# Patient Record
Sex: Male | Born: 1935 | Race: Black or African American | Hispanic: No | Marital: Married | Smoking: Never smoker
Health system: Southern US, Community
[De-identification: ages and names within clinical notes are randomized; demographics above are authoritative.]

## PROBLEM LIST (undated history)

## (undated) DIAGNOSIS — M1990 Unspecified osteoarthritis, unspecified site: Secondary | ICD-10-CM

## (undated) DIAGNOSIS — I10 Essential (primary) hypertension: Secondary | ICD-10-CM

## (undated) DIAGNOSIS — H5440 Blindness, one eye, unspecified eye: Secondary | ICD-10-CM

---

## 2000-09-30 ENCOUNTER — Encounter: Payer: Self-pay | Admitting: Emergency Medicine

## 2000-09-30 ENCOUNTER — Emergency Department (HOSPITAL_COMMUNITY): Admission: EM | Admit: 2000-09-30 | Discharge: 2000-09-30 | Payer: Self-pay | Admitting: Emergency Medicine

## 2000-10-01 ENCOUNTER — Emergency Department (HOSPITAL_COMMUNITY): Admission: EM | Admit: 2000-10-01 | Discharge: 2000-10-01 | Payer: Self-pay | Admitting: Emergency Medicine

## 2000-10-02 ENCOUNTER — Emergency Department (HOSPITAL_COMMUNITY): Admission: EM | Admit: 2000-10-02 | Discharge: 2000-10-02 | Payer: Self-pay

## 2000-10-05 ENCOUNTER — Emergency Department (HOSPITAL_COMMUNITY): Admission: EM | Admit: 2000-10-05 | Discharge: 2000-10-05 | Payer: Self-pay | Admitting: Emergency Medicine

## 2000-10-05 ENCOUNTER — Encounter: Payer: Self-pay | Admitting: Emergency Medicine

## 2008-07-21 ENCOUNTER — Emergency Department (HOSPITAL_COMMUNITY): Admission: EM | Admit: 2008-07-21 | Discharge: 2008-07-21 | Payer: Self-pay | Admitting: Emergency Medicine

## 2008-07-23 ENCOUNTER — Emergency Department (HOSPITAL_COMMUNITY): Admission: EM | Admit: 2008-07-23 | Discharge: 2008-07-23 | Payer: Self-pay | Admitting: Emergency Medicine

## 2009-01-25 ENCOUNTER — Emergency Department (HOSPITAL_COMMUNITY): Admission: EM | Admit: 2009-01-25 | Discharge: 2009-01-25 | Payer: Self-pay | Admitting: Emergency Medicine

## 2011-06-13 LAB — URINALYSIS, ROUTINE W REFLEX MICROSCOPIC
Bilirubin Urine: NEGATIVE
Glucose, UA: NEGATIVE
Hgb urine dipstick: NEGATIVE
Ketones, ur: NEGATIVE
Nitrite: NEGATIVE
Specific Gravity, Urine: 1.002 — ABNORMAL LOW

## 2011-06-13 LAB — CBC
HCT: 36.1 — ABNORMAL LOW
RBC: 4.16 — ABNORMAL LOW
RDW: 15.1

## 2011-06-13 LAB — GLUCOSE, CAPILLARY

## 2011-06-13 LAB — DIFFERENTIAL
Eosinophils Absolute: 0.1
Eosinophils Relative: 2
Lymphocytes Relative: 24
Monocytes Relative: 9
Neutrophils Relative %: 65

## 2011-06-13 LAB — BASIC METABOLIC PANEL
BUN: 4 — ABNORMAL LOW
CO2: 28
Calcium: 9.3
Chloride: 106
Creatinine, Ser: 0.92
Potassium: 4.2

## 2011-06-25 ENCOUNTER — Inpatient Hospital Stay (INDEPENDENT_AMBULATORY_CARE_PROVIDER_SITE_OTHER)
Admission: RE | Admit: 2011-06-25 | Discharge: 2011-06-25 | Disposition: A | Discharge status: Home / Self Care | Payer: Medicare Other | Source: Ambulatory Visit | Attending: Family Medicine | Admitting: Family Medicine

## 2011-06-25 DIAGNOSIS — I10 Essential (primary) hypertension: Secondary | ICD-10-CM

## 2011-06-25 DIAGNOSIS — L03019 Cellulitis of unspecified finger: Secondary | ICD-10-CM

## 2011-06-25 LAB — POCT I-STAT, CHEM 8
BUN: 8 mg/dL (ref 6–23)
Creatinine, Ser: 0.8 mg/dL (ref 0.50–1.35)
Glucose, Bld: 102 mg/dL — ABNORMAL HIGH (ref 70–99)
Potassium: 3.6 mEq/L (ref 3.5–5.1)
Sodium: 141 mEq/L (ref 135–145)

## 2016-11-07 ENCOUNTER — Inpatient Hospital Stay (HOSPITAL_COMMUNITY)
Admission: EM | Admit: 2016-11-07 | Discharge: 2016-11-09 | DRG: 181 | Disposition: A | Discharge status: Hospice | Payer: Medicare Other | Attending: Internal Medicine | Admitting: Internal Medicine

## 2016-11-07 ENCOUNTER — Encounter (HOSPITAL_COMMUNITY): Payer: Self-pay | Admitting: Nurse Practitioner

## 2016-11-07 DIAGNOSIS — F17290 Nicotine dependence, other tobacco product, uncomplicated: Secondary | ICD-10-CM | POA: Diagnosis present

## 2016-11-07 DIAGNOSIS — Z66 Do not resuscitate: Secondary | ICD-10-CM | POA: Diagnosis present

## 2016-11-07 DIAGNOSIS — R627 Adult failure to thrive: Secondary | ICD-10-CM | POA: Diagnosis present

## 2016-11-07 DIAGNOSIS — M7989 Other specified soft tissue disorders: Secondary | ICD-10-CM | POA: Diagnosis not present

## 2016-11-07 DIAGNOSIS — C3490 Malignant neoplasm of unspecified part of unspecified bronchus or lung: Principal | ICD-10-CM | POA: Diagnosis present

## 2016-11-07 DIAGNOSIS — H5461 Unqualified visual loss, right eye, normal vision left eye: Secondary | ICD-10-CM | POA: Diagnosis present

## 2016-11-07 DIAGNOSIS — D649 Anemia, unspecified: Secondary | ICD-10-CM | POA: Diagnosis present

## 2016-11-07 DIAGNOSIS — L89151 Pressure ulcer of sacral region, stage 1: Secondary | ICD-10-CM | POA: Diagnosis present

## 2016-11-07 DIAGNOSIS — I82622 Acute embolism and thrombosis of deep veins of left upper extremity: Secondary | ICD-10-CM | POA: Diagnosis present

## 2016-11-07 DIAGNOSIS — I10 Essential (primary) hypertension: Secondary | ICD-10-CM | POA: Diagnosis present

## 2016-11-07 DIAGNOSIS — R64 Cachexia: Secondary | ICD-10-CM | POA: Diagnosis present

## 2016-11-07 DIAGNOSIS — I890 Lymphedema, not elsewhere classified: Secondary | ICD-10-CM | POA: Diagnosis present

## 2016-11-07 DIAGNOSIS — Z6822 Body mass index (BMI) 22.0-22.9, adult: Secondary | ICD-10-CM

## 2016-11-07 DIAGNOSIS — L8990 Pressure ulcer of unspecified site, unspecified stage: Secondary | ICD-10-CM | POA: Insufficient documentation

## 2016-11-07 DIAGNOSIS — R0902 Hypoxemia: Secondary | ICD-10-CM | POA: Diagnosis present

## 2016-11-07 DIAGNOSIS — E860 Dehydration: Secondary | ICD-10-CM | POA: Diagnosis present

## 2016-11-07 DIAGNOSIS — I871 Compression of vein: Secondary | ICD-10-CM | POA: Diagnosis present

## 2016-11-07 DIAGNOSIS — R918 Other nonspecific abnormal finding of lung field: Secondary | ICD-10-CM

## 2016-11-07 DIAGNOSIS — Z515 Encounter for palliative care: Secondary | ICD-10-CM | POA: Diagnosis present

## 2016-11-07 DIAGNOSIS — J90 Pleural effusion, not elsewhere classified: Secondary | ICD-10-CM | POA: Diagnosis present

## 2016-11-07 DIAGNOSIS — D72829 Elevated white blood cell count, unspecified: Secondary | ICD-10-CM | POA: Diagnosis present

## 2016-11-07 HISTORY — DX: Unspecified osteoarthritis, unspecified site: M19.90

## 2016-11-07 HISTORY — DX: Essential (primary) hypertension: I10

## 2016-11-07 HISTORY — DX: Blindness, one eye, unspecified eye: H54.40

## 2016-11-07 NOTE — ED Triage Notes (Signed)
EMS from home, family visited today and noticed entire left arm swelling.  Lives alone.  Pt reports that the swelling has been going on for weeks, placed a tourniquet on upper arm to stop swelling.  Now reports dec sensation in L hand, weak pulses L wrist.  Possible bed bugs d/t living situation.  Complains of chronic R flank pain that causes him not to have appetite. VS: 88/60, 72 bpm NSR, 14 RR, 106 CBG, 97% RA

## 2016-11-07 NOTE — ED Notes (Signed)
Bed: [facility] Expected date:  Expected time:  Means of arrival:  Comments: EMS arm pain

## 2016-11-08 ENCOUNTER — Emergency Department (HOSPITAL_COMMUNITY): Payer: Medicare Other

## 2016-11-08 ENCOUNTER — Observation Stay (HOSPITAL_BASED_OUTPATIENT_CLINIC_OR_DEPARTMENT_OTHER): Payer: Medicare Other

## 2016-11-08 ENCOUNTER — Encounter (HOSPITAL_COMMUNITY): Payer: Self-pay

## 2016-11-08 DIAGNOSIS — Z515 Encounter for palliative care: Secondary | ICD-10-CM

## 2016-11-08 DIAGNOSIS — J90 Pleural effusion, not elsewhere classified: Secondary | ICD-10-CM | POA: Diagnosis present

## 2016-11-08 DIAGNOSIS — H5461 Unqualified visual loss, right eye, normal vision left eye: Secondary | ICD-10-CM | POA: Diagnosis present

## 2016-11-08 DIAGNOSIS — R918 Other nonspecific abnormal finding of lung field: Secondary | ICD-10-CM | POA: Diagnosis not present

## 2016-11-08 DIAGNOSIS — I871 Compression of vein: Secondary | ICD-10-CM | POA: Diagnosis present

## 2016-11-08 DIAGNOSIS — M7989 Other specified soft tissue disorders: Secondary | ICD-10-CM

## 2016-11-08 DIAGNOSIS — R627 Adult failure to thrive: Secondary | ICD-10-CM | POA: Diagnosis not present

## 2016-11-08 DIAGNOSIS — R0902 Hypoxemia: Secondary | ICD-10-CM | POA: Diagnosis present

## 2016-11-08 DIAGNOSIS — F17290 Nicotine dependence, other tobacco product, uncomplicated: Secondary | ICD-10-CM | POA: Diagnosis present

## 2016-11-08 DIAGNOSIS — D649 Anemia, unspecified: Secondary | ICD-10-CM | POA: Diagnosis not present

## 2016-11-08 DIAGNOSIS — I82622 Acute embolism and thrombosis of deep veins of left upper extremity: Secondary | ICD-10-CM | POA: Diagnosis present

## 2016-11-08 DIAGNOSIS — I10 Essential (primary) hypertension: Secondary | ICD-10-CM | POA: Diagnosis present

## 2016-11-08 DIAGNOSIS — I890 Lymphedema, not elsewhere classified: Secondary | ICD-10-CM | POA: Diagnosis present

## 2016-11-08 DIAGNOSIS — Z66 Do not resuscitate: Secondary | ICD-10-CM | POA: Diagnosis present

## 2016-11-08 DIAGNOSIS — D72829 Elevated white blood cell count, unspecified: Secondary | ICD-10-CM | POA: Diagnosis present

## 2016-11-08 DIAGNOSIS — E860 Dehydration: Secondary | ICD-10-CM | POA: Diagnosis present

## 2016-11-08 DIAGNOSIS — C3490 Malignant neoplasm of unspecified part of unspecified bronchus or lung: Secondary | ICD-10-CM | POA: Diagnosis present

## 2016-11-08 DIAGNOSIS — Z6822 Body mass index (BMI) 22.0-22.9, adult: Secondary | ICD-10-CM | POA: Diagnosis not present

## 2016-11-08 DIAGNOSIS — R64 Cachexia: Secondary | ICD-10-CM | POA: Diagnosis present

## 2016-11-08 DIAGNOSIS — L89151 Pressure ulcer of sacral region, stage 1: Secondary | ICD-10-CM | POA: Diagnosis present

## 2016-11-08 LAB — URINALYSIS, ROUTINE W REFLEX MICROSCOPIC
BILIRUBIN URINE: NEGATIVE
Glucose, UA: NEGATIVE mg/dL
Hgb urine dipstick: NEGATIVE
KETONES UR: 20 mg/dL — AB
Leukocytes, UA: NEGATIVE
NITRITE: NEGATIVE
PH: 5 (ref 5.0–8.0)
Protein, ur: NEGATIVE mg/dL
Specific Gravity, Urine: >1.046 — ABNORMAL HIGH (ref 1.005–1.030)

## 2016-11-08 LAB — PROTIME-INR
INR: 1.37
PROTHROMBIN TIME: 17 s — AB (ref 11.4–15.2)

## 2016-11-08 LAB — CBC WITH DIFFERENTIAL/PLATELET
Basophils Absolute: 0 10*3/uL (ref 0.0–0.1)
Basophils Relative: 0 %
EOS PCT: 0 %
Eosinophils Absolute: 0 10*3/uL (ref 0.0–0.7)
HCT: 33.7 % — ABNORMAL LOW (ref 39.0–52.0)
Hemoglobin: 11.2 g/dL — ABNORMAL LOW (ref 13.0–17.0)
Lymphocytes Relative: 6 %
Lymphs Abs: 0.8 10*3/uL (ref 0.7–4.0)
MCH: 26.5 pg (ref 26.0–34.0)
MCHC: 33.2 g/dL (ref 30.0–36.0)
MCV: 79.9 fL (ref 78.0–100.0)
MONOS PCT: 4 %
Monocytes Absolute: 0.5 10*3/uL (ref 0.1–1.0)
NEUTROS ABS: 12.5 10*3/uL — AB (ref 1.7–7.7)
NEUTROS PCT: 91 %
PLATELETS: 135 10*3/uL — AB (ref 150–400)
RBC: 4.22 MIL/uL (ref 4.22–5.81)
RDW: 16.2 % — AB (ref 11.5–15.5)
WBC: 13.7 10*3/uL — AB (ref 4.0–10.5)

## 2016-11-08 LAB — I-STAT TROPONIN, ED: Troponin i, poc: 0.01 ng/mL (ref 0.00–0.08)

## 2016-11-08 LAB — LIPASE, BLOOD: LIPASE: 16 U/L (ref 11–51)

## 2016-11-08 LAB — I-STAT CHEM 8, ED
BUN: 32 mg/dL — AB (ref 6–20)
Calcium, Ion: 1.44 mmol/L — ABNORMAL HIGH (ref 1.15–1.40)
Chloride: 96 mmol/L — ABNORMAL LOW (ref 101–111)
Creatinine, Ser: 1 mg/dL (ref 0.61–1.24)
Glucose, Bld: 93 mg/dL (ref 65–99)
HEMATOCRIT: 40 % (ref 39.0–52.0)
Hemoglobin: 13.6 g/dL (ref 13.0–17.0)
Potassium: 4 mmol/L (ref 3.5–5.1)
SODIUM: 135 mmol/L (ref 135–145)
TCO2: 28 mmol/L (ref 0–100)

## 2016-11-08 LAB — COMPREHENSIVE METABOLIC PANEL
ALT: 21 U/L (ref 17–63)
ANION GAP: 11 (ref 5–15)
AST: 45 U/L — ABNORMAL HIGH (ref 15–41)
Albumin: 3.1 g/dL — ABNORMAL LOW (ref 3.5–5.0)
Alkaline Phosphatase: 123 U/L (ref 38–126)
BUN: 33 mg/dL — ABNORMAL HIGH (ref 6–20)
CHLORIDE: 99 mmol/L — AB (ref 101–111)
CO2: 26 mmol/L (ref 22–32)
CREATININE: 0.81 mg/dL (ref 0.61–1.24)
Calcium: 11.9 mg/dL — ABNORMAL HIGH (ref 8.9–10.3)
Glucose, Bld: 95 mg/dL (ref 65–99)
POTASSIUM: 4.2 mmol/L (ref 3.5–5.1)
Sodium: 136 mmol/L (ref 135–145)
Total Bilirubin: 1.4 mg/dL — ABNORMAL HIGH (ref 0.3–1.2)
Total Protein: 7.2 g/dL (ref 6.5–8.1)

## 2016-11-08 MED ORDER — ACETAMINOPHEN 650 MG RE SUPP: 650.0000 mg | Freq: Four times a day (QID) | RECTAL | Status: DC | PRN

## 2016-11-08 MED ORDER — ENOXAPARIN SODIUM 100 MG/ML ~~LOC~~ SOLN
1.5000 mg/kg | SUBCUTANEOUS | Status: DC
  Administered 2016-11-09: 100 mg via SUBCUTANEOUS
  Filled 2016-11-08: qty 1

## 2016-11-08 MED ORDER — IPRATROPIUM-ALBUTEROL 0.5-2.5 (3) MG/3ML IN SOLN: 3.0000 mL | Freq: Four times a day (QID) | RESPIRATORY_TRACT | Status: DC

## 2016-11-08 MED ORDER — ACETAMINOPHEN 325 MG PO TABS: 650.0000 mg | ORAL_TABLET | Freq: Four times a day (QID) | ORAL | Status: DC | PRN

## 2016-11-08 MED ORDER — IOPAMIDOL (ISOVUE-370) INJECTION 76%
100.0000 mL | Freq: Once | INTRAVENOUS | Status: AC | PRN
  Administered 2016-11-08: 100 mL via INTRAVENOUS

## 2016-11-08 MED ORDER — SODIUM CHLORIDE 0.9 % IV BOLUS (SEPSIS)
1000.0000 mL | Freq: Once | INTRAVENOUS | Status: AC
  Administered 2016-11-08: 1000 mL via INTRAVENOUS

## 2016-11-08 MED ORDER — IOPAMIDOL (ISOVUE-370) INJECTION 76%
INTRAVENOUS | Status: AC
  Administered 2016-11-08: 100 mL via INTRAVENOUS
  Filled 2016-11-08: qty 100

## 2016-11-08 MED ORDER — ENOXAPARIN SODIUM 80 MG/0.8ML ~~LOC~~ SOLN
1.0000 mg/kg | Freq: Once | SUBCUTANEOUS | Status: AC
  Administered 2016-11-08: 65 mg via SUBCUTANEOUS
  Filled 2016-11-08: qty 0.8

## 2016-11-08 MED ORDER — SODIUM CHLORIDE 0.9 % IV SOLN
INTRAVENOUS | Status: DC
  Administered 2016-11-08: 05:00:00 via INTRAVENOUS
  Administered 2016-11-09: 1000 mL via INTRAVENOUS

## 2016-11-08 MED ORDER — HYDROCODONE-ACETAMINOPHEN 5-325 MG PO TABS: 1.0000 | ORAL_TABLET | ORAL | Status: DC | PRN

## 2016-11-08 MED ORDER — IPRATROPIUM-ALBUTEROL 0.5-2.5 (3) MG/3ML IN SOLN
3.0000 mL | Freq: Three times a day (TID) | RESPIRATORY_TRACT | Status: DC
  Administered 2016-11-08 – 2016-11-09 (×3): 3 mL via RESPIRATORY_TRACT
  Filled 2016-11-08 (×3): qty 3

## 2016-11-08 MED ORDER — ACETAMINOPHEN 325 MG PO TABS
650.0000 mg | ORAL_TABLET | Freq: Three times a day (TID) | ORAL | Status: DC
  Administered 2016-11-08 – 2016-11-09 (×3): 650 mg via ORAL
  Filled 2016-11-08 (×3): qty 2

## 2016-11-08 MED ORDER — IPRATROPIUM-ALBUTEROL 0.5-2.5 (3) MG/3ML IN SOLN
3.0000 mL | Freq: Four times a day (QID) | RESPIRATORY_TRACT | Status: DC
  Administered 2016-11-08: 3 mL via RESPIRATORY_TRACT
  Filled 2016-11-08: qty 3

## 2016-11-08 MED ORDER — ONDANSETRON HCL 4 MG PO TABS: 4.0000 mg | ORAL_TABLET | Freq: Four times a day (QID) | ORAL | Status: DC | PRN

## 2016-11-08 MED ORDER — PANTOPRAZOLE SODIUM 40 MG IV SOLR
40.0000 mg | Freq: Two times a day (BID) | INTRAVENOUS | Status: DC
  Administered 2016-11-08 – 2016-11-09 (×3): 40 mg via INTRAVENOUS
  Filled 2016-11-08 (×3): qty 40

## 2016-11-08 MED ORDER — IPRATROPIUM-ALBUTEROL 0.5-2.5 (3) MG/3ML IN SOLN: 3.0000 mL | RESPIRATORY_TRACT | Status: DC | PRN

## 2016-11-08 MED ORDER — SIMETHICONE 80 MG PO CHEW: 80.0000 mg | CHEWABLE_TABLET | Freq: Four times a day (QID) | ORAL | Status: DC | PRN

## 2016-11-08 MED ORDER — IPRATROPIUM-ALBUTEROL 0.5-2.5 (3) MG/3ML IN SOLN: 3.0000 mL | Freq: Once | RESPIRATORY_TRACT | Status: DC

## 2016-11-08 MED ORDER — ENSURE ENLIVE PO LIQD
237.0000 mL | Freq: Three times a day (TID) | ORAL | Status: DC
  Administered 2016-11-08 – 2016-11-09 (×2): 237 mL via ORAL

## 2016-11-08 MED ORDER — BISACODYL 5 MG PO TBEC
5.0000 mg | DELAYED_RELEASE_TABLET | Freq: Every day | ORAL | Status: DC
  Administered 2016-11-08: 5 mg via ORAL
  Filled 2016-11-08: qty 1

## 2016-11-08 MED ORDER — ENOXAPARIN SODIUM 40 MG/0.4ML ~~LOC~~ SOLN
40.0000 mg | SUBCUTANEOUS | Status: DC
Start: 2016-11-08 — End: 2016-11-08
  Administered 2016-11-08: 40 mg via SUBCUTANEOUS
  Filled 2016-11-08: qty 0.4

## 2016-11-08 MED ORDER — GUAIFENESIN 100 MG/5ML PO SOLN: 200.0000 mg | Freq: Three times a day (TID) | ORAL | Status: DC | PRN

## 2016-11-08 MED ORDER — MORPHINE SULFATE (PF) 4 MG/ML IV SOLN: 1.0000 mg | INTRAVENOUS | Status: DC | PRN

## 2016-11-08 MED ORDER — ONDANSETRON HCL 4 MG/2ML IJ SOLN: 4.0000 mg | Freq: Four times a day (QID) | INTRAMUSCULAR | Status: DC | PRN

## 2016-11-08 NOTE — Consult Note (Signed)
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 Found to have a very large lung mass compressing the pulmonary veins and vasculature. 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  Clinical Assessment and Goals of Care: I met with Mr. Redner today who tells me he is ready to go and that he feels like God is cradling him like a mother who holds a newborn close to her chest- he tells me he feels complete peace, that the pain and swelling are leaving his body. He speaks to me in Rhymes and poetry, he makes jokes and tells big stories. He told me that he built [employer] and half of [employer]. He is proud of the 4 generations of family in his room. They are aware of how sick he is and that he is dying.  NEXT OF KIN- daughters present in the room.    SUMMARY OF RECOMMENDATIONS    1. Patient does not have any desire to see doctors or choose medically aggressive treatment or intervention -he has lived this long and really never seen a doctor or used medical services. He has self treated and when he tied a tournequet around his arm to stop the swelling things got much worse so he agreed after family forced him to come to the hospital.  No oncology consultation needed  Focus on comfort and dignity  2. Patient cannot live by himself. Family are discussing options.  3. I recommended a HOSPICE  facility for his care- he is going to have a sudden and rapid death-<2 weeks-I reviewed his scans and this tumor is sitting right next to major vessels. He also has pain and swelling in his left arm that need to be managed- he has dyspnea with minimal exertion.    Code Status/Advance Care Planning:  DNR    Symptom Management:   Scheduled Tylenol  PRN morphine or hydrocodone  Palliative Prophylaxis:   Bowel Regimen  Additional Recommendations (Limitations, Scope, Preferences):  Minimize Medications  Psycho-social/Spiritual:   Desire for further Chaplaincy support:yes  Additional Recommendations: Caregiving  Support/Resources  Prognosis:   < 2 weeks  Discharge Planning: Hospice facility      Primary Diagnoses: Present on Admission: . Failure to thrive in adult . Mass of right lung . Leukocytosis . Left arm swelling . Anemia . Hypercalcemia   I have reviewed the medical record, interviewed the patient and family, and examined the patient. The following aspects are pertinent.  Past Medical History:  Diagnosis Date  . Arthritis   .  Blind right eye   . Hypertension    Social History   Social History  . Marital status: Married    Spouse name: N/A  . Number of children: N/A  . Years of education: N/A   Social History Main Topics  . Smoking status: Never Smoker  . Smokeless tobacco: Current User    Types: Snuff  . Alcohol use No  . Drug use: No  . Sexual activity: No   Other Topics Concern  . None   Social History Narrative  . None   No family history on file. Scheduled Meds: . enoxaparin (LOVENOX) injection  40 mg Subcutaneous Q24H  . feeding supplement (ENSURE ENLIVE)  237 mL Oral TID BM  . ipratropium-albuterol  3 mL Nebulization Once  . pantoprazole (PROTONIX) IV  40 mg Intravenous Q12H   Continuous Infusions: . sodium chloride 75 mL/hr at 11/08/16 0433   PRN Meds:.acetaminophen **OR** acetaminophen, guaiFENesin,  HYDROcodone-acetaminophen, ipratropium-albuterol, morphine injection, ondansetron **OR** ondansetron (ZOFRAN) IV, simethicone Medications Prior to Admission:  Prior to Admission medications   Medication Sig Start Date End Date Taking? Authorizing Provider  guaiFENesin (ROBITUSSIN) 100 MG/5ML liquid Take 200 mg by mouth 3 (three) times daily as needed for cough.   Yes Historical Provider, MD  simethicone (MYLICON) 80 MG chewable tablet Chew 80 mg by mouth every 6 (six) hours as needed for flatulence.   Yes Historical Provider, MD   No Known Allergies Review of Systems  Physical Exam  Vital Signs: BP 97/70 (BP Location: Right Arm)   Pulse 67   Temp 97.9 F (36.6 C) (Oral)   Resp 16   Ht 5' 8" (1.727 m)   Wt 65.8 kg (145 lb)   SpO2 90%   BMI 22.05 kg/m  Pain Assessment: No/denies pain   Pain Score: 0-No pain   SpO2: SpO2: 90 % O2 Device:SpO2: 90 % O2 Flow Rate: .   IO: Intake/output summary:  Intake/Output Summary (Last 24 hours) at 11/08/16 1138 Last data filed at 11/08/16 1003  Gross per 24 hour  Intake              480 ml  Output                0 ml  Net              480 ml    LBM:   Baseline Weight: Weight: 65.8 kg (145 lb) Most recent weight: Weight: 65.8 kg (145 lb)     Palliative Assessment/Data:   Flowsheet Rows   Flowsheet Row Most Recent Value  Intake Tab  Referral Department  Hospitalist  Unit at Time of Referral  Oncology Unit  Palliative Care Primary Diagnosis  Cancer  Date Notified  11/08/16  Palliative Care Type  New Palliative care  Reason for referral  Clarify Goals of Care, Counsel Regarding Hospice  Date of Admission  11/08/16  Date first seen by Palliative Care  11/08/16  # of days Palliative referral response time  0 Day(s)  # of days IP prior to Palliative referral  0  Clinical Assessment  Palliative Performance Scale Score  30%  Pain Max last 24 hours  0  Pain Min Last 24 hours  0  Dyspnea Max Last 24 Hours  8  Dyspnea Min Last 24  hours  0  Nausea Max Last 24 Hours  0  Nausea Min Last 24 Hours  0  Anxiety Max Last 24 Hours  0  Anxiety Min Last 24   Hours  0  Other Max Last 24 Hours  0  Psychosocial & Spiritual Assessment  Palliative Care Outcomes  Palliative Care Outcomes  Clarified goals of care, Counseled regarding hospice       Time Total: 70 minutes Greater than 50%  of this time was spent counseling and coordinating care related to the above assessment and plan.  Signed by: Lane Hacker, DO   Please contact Palliative Medicine Team phone at [phone] for questions and concerns.  For individual provider: See Shea Evans

## 2016-11-08 NOTE — Progress Notes (Signed)
PROGRESS NOTE    Jeffren Dombek  BWL:[id number] DOB: 02/25/1921 DOA: 11/07/2016 PCP: No PCP Per Patient    Brief Narrative: Thomas Christensen is a 83 y.o. male with medical history significant of HTN, blind in right eye, and arthritis; who presents complaints of progressively worsening left arm swelling for the last 3 weeks. He reports associated symptoms of tightness in his chest, right flank pain, nausea, generalized weakness, decreased oral intake, and weight loss of unknown amount. Patient reports trying to take prune juice and Gas-x without relief of symptoms. He reports intermittently forcing herself to vomit with some mild relief of symptoms. At baseline patient lives alone and was previously able to complete all of his ADLs. However, over the last week or more he's gotten to the point which he was unable to walk or care for himself like normal at home. Family members normally come to check on him once every 1-2 weeks. Family members had come to visit him today and seen the patient was not able to care for himself along with the left arm swelling and had called EMS.   Assessment & Plan:   Principal Problem:   Failure to thrive in adult Active Problems:   Mass of right lung   Leukocytosis   Left arm swelling   Anemia   Hypercalcemia   1-Lung Mass, pleural effusion; probably lung cancer.  Palliative care consulted. Option for thoracentesis was provide to patient and family. They had discussion with palliative care team, goal is for comfort care.  Hoping for residential hospice placement.     DVT prophylaxis: lovenox.  Code Status: DNR Family Communication: care discussed with multiples family member  Disposition Plan: remain in the hospital.    Consultants:   Palliative care,    Procedures: doppler   Antimicrobials: none  Subjective: Patient denies pain. He has left arm swelling for a while.  Multiples family members at bedside,   Objective: Vitals:   11/08/16 0529  11/08/16 0602 11/08/16 0641 11/08/16 0800  BP: 99/62 111/69 97/70   Pulse: 61 84 67   Resp: '20 10 16   '$ Temp:      TempSrc:   Oral   SpO2: 90% (!) 89% 90%   Weight:      Height:    '5\' 8"'$  (1.727 m)    Intake/Output Summary (Last 24 hours) at 11/08/16 1110 Last data filed at 11/08/16 1003  Gross per 24 hour  Intake              480 ml  Output                0 ml  Net              480 ml   Filed Weights   11/07/16 2353  Weight: 65.8 kg (145 lb)    Examination:  General exam: Appears calm and comfortable  Respiratory system: Clear to auscultation. Respiratory effort normal. Cardiovascular system: S1 & S2 heard, RRR. No JVD, murmurs, rubs, gallops or clicks. No pedal edema. Gastrointestinal system: Abdomen is nondistended, soft and nontender. No organomegaly or masses felt. Normal bowel sounds heard. Central nervous system: Alert and oriented. No focal neurological deficits. Extremities: Symmetric 5 x 5 power. Swelling left arm.  Skin: multiples nodule skin, extremities.      Data Reviewed: I have personally reviewed following labs and imaging studies  CBC:  Recent Labs Lab 11/08/16 0040 11/08/16 0052  WBC 13.7*  --   NEUTROABS 12.5*  --  HGB 11.2* 13.6  HCT 33.7* 40.0  MCV 79.9  --   PLT 135*  --    Basic Metabolic Panel:  Recent Labs Lab 11/08/16 0040 11/08/16 0052  NA 136 135  K 4.2 4.0  CL 99* 96*  CO2 26  --   GLUCOSE 95 93  BUN 33* 32*  CREATININE 0.81 1.00  CALCIUM 11.9*  --    GFR: Estimated Creatinine Clearance: 43 mL/min (by C-G formula based on SCr of 1 mg/dL). Liver Function Tests:  Recent Labs Lab 11/08/16 0040  AST 45*  ALT 21  ALKPHOS 123  BILITOT 1.4*  PROT 7.2  ALBUMIN 3.1*    Recent Labs Lab 11/08/16 0040  LIPASE 16   No results for input(s): AMMONIA in the last 168 hours. Coagulation Profile:  Recent Labs Lab 11/08/16 0040  INR 1.37   Cardiac Enzymes: No results for input(s): CKTOTAL, CKMB, CKMBINDEX,  TROPONINI in the last 168 hours. BNP (last 3 results) No results for input(s): PROBNP in the last 8760 hours. HbA1C: No results for input(s): HGBA1C in the last 72 hours. CBG: No results for input(s): GLUCAP in the last 168 hours. Lipid Profile: No results for input(s): CHOL, HDL, LDLCALC, TRIG, CHOLHDL, LDLDIRECT in the last 72 hours. Thyroid Function Tests: No results for input(s): TSH, T4TOTAL, FREET4, T3FREE, THYROIDAB in the last 72 hours. Anemia Panel: No results for input(s): VITAMINB12, FOLATE, FERRITIN, TIBC, IRON, RETICCTPCT in the last 72 hours. Sepsis Labs: No results for input(s): PROCALCITON, LATICACIDVEN in the last 168 hours.  No results found for this or any previous visit (from the past 240 hour(s)).       Radiology Studies: Ct Angio Chest/abd/pel For Dissection W And/or Wo Contrast  Result Date: 11/08/2016 CLINICAL DATA:  Chest and abdominal pain radiating to the back. EXAM: CT ANGIOGRAPHY CHEST, ABDOMEN AND PELVIS TECHNIQUE: Multidetector CT imaging through the chest, abdomen and pelvis was performed using the standard protocol during bolus administration of intravenous contrast. Multiplanar reconstructed images and MIPs were obtained and reviewed to evaluate the vascular anatomy. CONTRAST:  100 cc Isovue 370 IV COMPARISON:  Chest radiograph 07/21/2008.  No recent comparison. FINDINGS: CTA CHEST FINDINGS Cardiovascular: Diffuse thoracic aortic tortuosity with mild atherosclerosis. Mild aneurysmal dilatation of the ascending segment measuring 4.1 cm maximal dimension. There is no dissection. No central pulmonary embolus. Large right lung mass causes compression and likely invasion of the right pulmonary veins. Decreased density of the blood pool consistent with anemia. Multiple mediastinal collaterals. Mediastinum/Nodes: Large heterogeneous right hilar mass near completely filling the right middle lobe. Discretely separate hilar adenopathy is not visualized. No definite  left hilar adenopathy. No enlarged mediastinal lymph nodes. Patulous upper esophagus. Lungs/Pleura: Advanced emphysema. Large right middle lobe mass measures 10 x 9.3 x 10 cm, heterogeneous and enhancing. This mass causes compression of the right upper lobe bronchus. Advanced emphysema. There is a moderate-sized right pleural effusion with internal loculations/enhancement concerning for malignant effusion. Adjacent atelectasis in the right lower lobe. No additional nodule or pulmonary mass. Scattered mucus in the trachea and right lower lobe bronchus. Musculoskeletal: No blastic or definite destructive lytic lesions. Paucity of body fat suggesting cachexia. Enhancing subcutaneous nodule posteriorly measures 2.3 cm superficial to T9. There is enhancing left axillary nodule, may be lymph node or subcutaneous metastasis. Review of the MIP images confirms the above findings. CTA ABDOMEN AND PELVIS FINDINGS VASCULAR Aorta: Diffuse tortuosity and mild atherosclerosis. No aneurysm or dissection. Celiac: Patent without evidence of aneurysm, dissection, vasculitis or  significant stenosis. SMA: Patent without evidence of aneurysm, dissection, vasculitis or significant stenosis. Renals: Both renal arteries are patent without evidence of aneurysm, dissection, vasculitis, fibromuscular dysplasia or significant stenosis. IMA: Patent without evidence of aneurysm, dissection, vasculitis or significant stenosis. Inflow: Tortuous with atherosclerosis. Both common iliac arteries measures 16 mm greatest dimension. Veins: Not well assessed on this arterial phase exam. Review of the MIP images confirms the above findings. NON-VASCULAR Hepatobiliary: Limited assessment for focal lesion given arterial phase imaging. Enhancing lesion in the inferior right lobe measures 2 cm. Gallbladder physiologically distended, no calcified stone. No biliary dilatation. Pancreas: Not well assessed due to paucity of intra-abdominal fat, atrophic  parenchyma. No ductal dilatation or evidence of inflammation. Spleen: Small in size, not well evaluated. Adrenals/Urinary Tract: Multiple bilateral renal cysts. Question of enhancing cortical lesion in the right kidney measures 14 x 10 mm, image 103 series 6, versus pararenal lesion. No hydronephrosis. Adrenal glands are not well-defined. Bladder is physiologically distended. Stomach/Bowel: Not well evaluated given paucity of intra-abdominal fat and lack of enteric contrast. Soft tissue density in the pelvis is likely decompressed bowel, but not well-defined. Lymphatic: Enhancing subcutaneous nodules superficial to both gluteal muscles, largest on the right measures 2.4 cm. Limited assessment for intraabdominal adenopathy given technique and paucity of body fat. Probable enhancing left retroperitoneal nodes. Small enhancing nodule in the left pericolic gutter. Reproductive: Heterogeneous prostate gland. Other: Generalized paucity of body fat consistent with cachexia. Musculoskeletal: Enhancing lesion within or adjacent to the left iliopsoas muscle, suboptimally defined. Smaller enhancing lesion adjacent to the right iliopsoas muscle. Scoliosis and multilevel degenerative change throughout spine. Diffusely decreased bone mineral density. No blastic or definite destructive lytic lesions. Review of the MIP images confirms the above findings. IMPRESSION: 1. Large 10 cm right middle lobe mass compressing or invading the right inferior pulmonary veins. Moderate right pleural effusion, with internal all loculations concerning for malignant effusion. Favor primary lung malignancy given presence of advanced emphysema. Metastatic disease is considered less likely. 2. Enhancing lesion abuts the medial right kidney may be a primary renal lesion, which would be concerning for primary malignancy, versus pararenal metastasis. 3. Multifocal enhancing nodules in the subcutaneous tissues, adjacent to the iliopsoas musculature, left  retroperitoneum, left pericolic gutter, concerning for metastatic disease. Enhancing lesion in the inferior right lobe of the liver. 4. No aortic dissection. Diffuse thoracoabdominal aortic tortuosity. Aneurysmal dilatation of the ascending aorta, maximal dimension 4.1 cm. Recommend annual imaging followup by CTA or MRA pending patient condition. 5. Advanced emphysema. 6. Cachexia. Electronically Signed   By: Jeb Levering M.D.   On: 11/08/2016 02:38        Scheduled Meds: . enoxaparin (LOVENOX) injection  40 mg Subcutaneous Q24H  . feeding supplement (ENSURE ENLIVE)  237 mL Oral TID BM  . ipratropium-albuterol  3 mL Nebulization Once  . pantoprazole (PROTONIX) IV  40 mg Intravenous Q12H   Continuous Infusions: . sodium chloride 75 mL/hr at 11/08/16 0433     LOS: 0 days    Time spent: 35 minutes.     Elmarie Shiley, MD [facility] Pager [phone]  If 7PM-7AM, please contact night-coverage [email or web address] Password [email or web address] 11/08/2016, 11:10 AM

## 2016-11-08 NOTE — H&P (Signed)
History and Physical    Thomas Christensen IFO:[id number] DOB: April 13, 1921 DOA: 11/07/2016  Referring MD/NP/PA: Dr. Claudine Mouton PCP: No PCP Per Patient  Patient coming from: Home via EMS  Chief Complaint: Left arm swelling  HPI: Thomas Christensen is a 83 y.o. male with medical history significant of HTN, blind in right eye, and arthritis; who presents complaints of progressively worsening left arm swelling for the last 3 weeks. He reports associated symptoms of tightness in his chest, right flank pain, nausea, generalized weakness, decreased oral intake, and weight loss of unknown amount. Patient reports trying to take prune juice and Gas-x without relief of symptoms. He reports intermittently forcing herself to vomit with some mild relief of symptoms. At baseline patient lives alone and was previously able to complete all of his ADLs. However, over the last week or more he's gotten to the point which he was unable to walk or care for himself like normal at home. Family members normally come to check on him once every 1-2 weeks. Family members had come to visit him today and seen the patient was not able to care for himself along with the left arm swelling and had called EMS.  ED Course: Upon admission into the emergency and patient was  noted to be afebrile, pulse 77 and 97, respirations up to 22, blood pressure 95/66 to the 122/71, and O2 saturations 90%. Lab work revealed WBC 13.7, hemoglobin 11.2, platelets 135, BUN 33, creatinine 0.81. Urinalysis was negative for any signs of infection. CT angiogram of the chest revealed a large 10 cm mass of the right middle lobe of the lung, moderate right sided pleural effusion that appears loculated, lymphadenopathy, multiple subcutaneous enhancing nodules, and AAA up to 4.1 cm. Findings were discussed with the patient who requested no life support and states when the good [facility] is ready to take him it is his time.  Review of Systems: As per HPI otherwise 10 point review of  systems negative.   Past Medical History:  Diagnosis Date  . Arthritis   . Blind right eye   . Hypertension     History reviewed. No pertinent surgical history.   reports that he has never smoked. His smokeless tobacco use includes Snuff. He reports that he does not drink alcohol or use drugs.  No Known Allergies  No family history on file.  Prior to Admission medications   Medication Sig Start Date End Date Taking? Authorizing Provider  guaiFENesin (ROBITUSSIN) 100 MG/5ML liquid Take 200 mg by mouth 3 (three) times daily as needed for cough.   Yes Historical Provider, MD  simethicone (MYLICON) 80 MG chewable tablet Chew 80 mg by mouth every 6 (six) hours as needed for flatulence.   Yes Historical Provider, MD    Physical Exam:  Constitutional: Cachectic elderly male who appears acutely ill. Vitals:   11/07/16 2353 11/07/16 2355 11/08/16 0201 11/08/16 0247  BP:  101/66 122/71 96/73  Pulse:  79 81 87  Resp:  '18 18 12  '$ Temp:  97.9 F (36.6 C)    TempSrc:  Oral    SpO2:  100% 99% 100%  Weight: 65.8 kg (145 lb)     Height: '5\' 8"'$  (1.727 m)      Eyes: Blind in right eye ENMT: Mucous membranes are moist. Posterior pharynx clear of any exudate or lesions.Normal dentition.  Neck: normal, supple, no masses, no thyromegaly Respiratory: Decreased overall aeration noted on the right middle and lower lung fields. Positive rales noted.  Cardiovascular: Distant  heart sounds with Regular rate and rhythm, no murmurs / rubs / gallops. Left upper extremity edema twice size of the right upper extremity. 2+ pedal pulses. No carotid bruits.  Abdomen: no tenderness, no masses palpated. No hepatosplenomegaly. Bowel sounds positive.  Musculoskeletal: Clubbing noted of the bilateral hands. Significant Skin: Multiple subcutaneous nodules noted on the skin measuring 1-2 cm in diameter. rologic: CN 2-12 grossly intact. Sensation intact, DTR normal. Strength 5/5 in all 4.  Psychiatric: Normal  judgment and insight. Alert and oriented x 3. Normal mood.     Labs on Admission: I have personally reviewed following labs and imaging studies  CBC:  Recent Labs Lab 11/08/16 0040 11/08/16 0052  WBC 13.7*  --   NEUTROABS 12.5*  --   HGB 11.2* 13.6  HCT 33.7* 40.0  MCV 79.9  --   PLT 135*  --    Basic Metabolic Panel:  Recent Labs Lab 11/08/16 0040 11/08/16 0052  NA 136 135  K 4.2 4.0  CL 99* 96*  CO2 26  --   GLUCOSE 95 93  BUN 33* 32*  CREATININE 0.81 1.00  CALCIUM 11.9*  --    GFR: Estimated Creatinine Clearance: 43 mL/min (by C-G formula based on SCr of 1 mg/dL). Liver Function Tests:  Recent Labs Lab 11/08/16 0040  AST 45*  ALT 21  ALKPHOS 123  BILITOT 1.4*  PROT 7.2  ALBUMIN 3.1*    Recent Labs Lab 11/08/16 0040  LIPASE 16   No results for input(s): AMMONIA in the last 168 hours. Coagulation Profile:  Recent Labs Lab 11/08/16 0040  INR 1.37   Cardiac Enzymes: No results for input(s): CKTOTAL, CKMB, CKMBINDEX, TROPONINI in the last 168 hours. BNP (last 3 results) No results for input(s): PROBNP in the last 8760 hours. HbA1C: No results for input(s): HGBA1C in the last 72 hours. CBG: No results for input(s): GLUCAP in the last 168 hours. Lipid Profile: No results for input(s): CHOL, HDL, LDLCALC, TRIG, CHOLHDL, LDLDIRECT in the last 72 hours. Thyroid Function Tests: No results for input(s): TSH, T4TOTAL, FREET4, T3FREE, THYROIDAB in the last 72 hours. Anemia Panel: No results for input(s): VITAMINB12, FOLATE, FERRITIN, TIBC, IRON, RETICCTPCT in the last 72 hours. Urine analysis:    Component Value Date/Time   COLORURINE YELLOW 07/21/2008 1344   APPEARANCEUR CLOUDY (A) 07/21/2008 1344   LABSPEC 1.002 (L) 07/21/2008 1344   PHURINE 7.5 07/21/2008 1344   GLUCOSEU NEGATIVE 07/21/2008 1344   HGBUR NEGATIVE 07/21/2008 1344   BILIRUBINUR NEGATIVE 07/21/2008 1344   KETONESUR NEGATIVE 07/21/2008 1344   PROTEINUR NEGATIVE 07/21/2008 1344    UROBILINOGEN 0.2 07/21/2008 1344   NITRITE NEGATIVE 07/21/2008 1344   LEUKOCYTESUR  07/21/2008 1344    NEGATIVE MICROSCOPIC NOT DONE ON URINES WITH NEGATIVE PROTEIN, BLOOD, LEUKOCYTES, NITRITE, OR GLUCOSE <1000 mg/dL.   Sepsis Labs: No results found for this or any previous visit (from the past 240 hour(s)).   Radiological Exams on Admission: Ct Angio Chest/abd/pel For Dissection W And/or Wo Contrast  Result Date: 11/08/2016 CLINICAL DATA:  Chest and abdominal pain radiating to the back. EXAM: CT ANGIOGRAPHY CHEST, ABDOMEN AND PELVIS TECHNIQUE: Multidetector CT imaging through the chest, abdomen and pelvis was performed using the standard protocol during bolus administration of intravenous contrast. Multiplanar reconstructed images and MIPs were obtained and reviewed to evaluate the vascular anatomy. CONTRAST:  100 cc Isovue 370 IV COMPARISON:  Chest radiograph 07/21/2008.  No recent comparison. FINDINGS: CTA CHEST FINDINGS Cardiovascular: Diffuse thoracic aortic tortuosity with  mild atherosclerosis. Mild aneurysmal dilatation of the ascending segment measuring 4.1 cm maximal dimension. There is no dissection. No central pulmonary embolus. Large right lung mass causes compression and likely invasion of the right pulmonary veins. Decreased density of the blood pool consistent with anemia. Multiple mediastinal collaterals. Mediastinum/Nodes: Large heterogeneous right hilar mass near completely filling the right middle lobe. Discretely separate hilar adenopathy is not visualized. No definite left hilar adenopathy. No enlarged mediastinal lymph nodes. Patulous upper esophagus. Lungs/Pleura: Advanced emphysema. Large right middle lobe mass measures 10 x 9.3 x 10 cm, heterogeneous and enhancing. This mass causes compression of the right upper lobe bronchus. Advanced emphysema. There is a moderate-sized right pleural effusion with internal loculations/enhancement concerning for malignant effusion. Adjacent  atelectasis in the right lower lobe. No additional nodule or pulmonary mass. Scattered mucus in the trachea and right lower lobe bronchus. Musculoskeletal: No blastic or definite destructive lytic lesions. Paucity of body fat suggesting cachexia. Enhancing subcutaneous nodule posteriorly measures 2.3 cm superficial to T9. There is enhancing left axillary nodule, may be lymph node or subcutaneous metastasis. Review of the MIP images confirms the above findings. CTA ABDOMEN AND PELVIS FINDINGS VASCULAR Aorta: Diffuse tortuosity and mild atherosclerosis. No aneurysm or dissection. Celiac: Patent without evidence of aneurysm, dissection, vasculitis or significant stenosis. SMA: Patent without evidence of aneurysm, dissection, vasculitis or significant stenosis. Renals: Both renal arteries are patent without evidence of aneurysm, dissection, vasculitis, fibromuscular dysplasia or significant stenosis. IMA: Patent without evidence of aneurysm, dissection, vasculitis or significant stenosis. Inflow: Tortuous with atherosclerosis. Both common iliac arteries measures 16 mm greatest dimension. Veins: Not well assessed on this arterial phase exam. Review of the MIP images confirms the above findings. NON-VASCULAR Hepatobiliary: Limited assessment for focal lesion given arterial phase imaging. Enhancing lesion in the inferior right lobe measures 2 cm. Gallbladder physiologically distended, no calcified stone. No biliary dilatation. Pancreas: Not well assessed due to paucity of intra-abdominal fat, atrophic parenchyma. No ductal dilatation or evidence of inflammation. Spleen: Small in size, not well evaluated. Adrenals/Urinary Tract: Multiple bilateral renal cysts. Question of enhancing cortical lesion in the right kidney measures 14 x 10 mm, image 103 series 6, versus pararenal lesion. No hydronephrosis. Adrenal glands are not well-defined. Bladder is physiologically distended. Stomach/Bowel: Not well evaluated given paucity of  intra-abdominal fat and lack of enteric contrast. Soft tissue density in the pelvis is likely decompressed bowel, but not well-defined. Lymphatic: Enhancing subcutaneous nodules superficial to both gluteal muscles, largest on the right measures 2.4 cm. Limited assessment for intraabdominal adenopathy given technique and paucity of body fat. Probable enhancing left retroperitoneal nodes. Small enhancing nodule in the left pericolic gutter. Reproductive: Heterogeneous prostate gland. Other: Generalized paucity of body fat consistent with cachexia. Musculoskeletal: Enhancing lesion within or adjacent to the left iliopsoas muscle, suboptimally defined. Smaller enhancing lesion adjacent to the right iliopsoas muscle. Scoliosis and multilevel degenerative change throughout spine. Diffusely decreased bone mineral density. No blastic or definite destructive lytic lesions. Review of the MIP images confirms the above findings. IMPRESSION: 1. Large 10 cm right middle lobe mass compressing or invading the right inferior pulmonary veins. Moderate right pleural effusion, with internal all loculations concerning for malignant effusion. Favor primary lung malignancy given presence of advanced emphysema. Metastatic disease is considered less likely. 2. Enhancing lesion abuts the medial right kidney may be a primary renal lesion, which would be concerning for primary malignancy, versus pararenal metastasis. 3. Multifocal enhancing nodules in the subcutaneous tissues, adjacent to the iliopsoas musculature, left  retroperitoneum, left pericolic gutter, concerning for metastatic disease. Enhancing lesion in the inferior right lobe of the liver. 4. No aortic dissection. Diffuse thoracoabdominal aortic tortuosity. Aneurysmal dilatation of the ascending aorta, maximal dimension 4.1 cm. Recommend annual imaging followup by CTA or MRA pending patient condition. 5. Advanced emphysema. 6. Cachexia. Electronically Signed   By: Jeb Levering  M.D.   On: 11/08/2016 02:38    EKG: Independently reviewed. Sinus rhythm with premature atrial complexes  Assessment/Plan Failure to thrive: Acute. Patient found to be significantly cachectic and in poor -  Admit to MedSurg bed  -  Ensure feeding supplement  Dehydration:  patient admits to decreased oral intake and also found to have elevated BUN to creatinine ratio.  - NS IVF at 75 ml/hr as tolerated  Right lung mass with lymphadenopathy/right pleural effusion: Acute. Seen on CT scan. Suggestive of stage IV disease. Patient currently noted to be of poor functional status. - Consult to palliative care - Consider thoracentesis for definitive diagnosis - May need to consult oncology as well for other palliative treatment option  Hypoxia: O2 saturations intermittently seen to be in the upper 80s on admission. - Nasal cannula oxygen 2 L  Left arm swelling: Acute. over the last 3 weeks question if related to tumor. - Vascular Doppler ultrasound of the left upper extremity  Leukocytosis: Acute. WBC elevated at 13.7 on admission. No clear source of infection noted on urinalysis. Question secondary to acute process. - Continue to monitor  Hypercalcemia: Calcium noted to be 11.9 on admission. Likely secondary to malignancy - IVF  - Follow-up repeat BMP  Anemia: Initial hemoglobin noted to be 11.2. No  previous baseline hemoglobin available. - Continue to monitor  DVT prophylaxis: lovenox Code Status: DO NOT RESUSCITATE Family Communication: Discussed plan of care with the patient and his great-great-grandfather Disposition Plan:  Likely needs placement Consults called: none Admission status: Observation  Norval Morton MD [facility] Pager [phone]  If 7PM-7AM, please contact night-coverage [email or web address] Password [email or web address]  11/08/2016, 3:02 AM

## 2016-11-08 NOTE — ED Notes (Signed)
Patient transported to CT 

## 2016-11-08 NOTE — Clinical Social Work Note (Signed)
Clinical Social Work Assessment  Patient Details  Name: Thomas Christensen MRN: [id number] Date of Birth: 1921-09-02  Date of referral:  11/08/16               Reason for consult:  Discharge Planning                Permission sought to share information with:  Facility Art therapist granted to share information::  Yes, Verbal Permission Granted  Name::        Agency::     Relationship::     Contact Information:     Housing/Transportation Living arrangements for the past 2 months:  Apartment Source of Information:  Patient, Other (Comment Required) (granddaughters) Patient Interpreter Needed:  None Criminal Activity/Legal Involvement Pertinent to Current Situation/Hospitalization:  No - Comment as needed Significant Relationships:  Adult Children, Other Family Members Lives with:  Self Do you feel safe going back to the place where you live?  Yes Need for family participation in patient care:  Yes (Comment)  Care giving concerns:  Family isn't sure if pt's care can be managed at home following hospital d/c.   Social Worker assessment / plan:  Pt hospitalized from home on 11/07/16 with Failure to thrive. CSW consulted to assist with residential hospice home placement. CSW spoke with pt and multiple family members at bedside. Pt indicated that he wanted family to stay with him at home. Granddaughter Thomas Christensen [facility] cell # [phone] ) feels pt may do better at a residential hospice home. CSW has contacted [facility], at granddaughter request, to see if a family member can stay overnight with pt. Granddaughter informed that [facility] does allow a family member to stay over night. [place] has no openings today. CSW left VM for granddaughter to contact CSW to assist with other residential hospice home options. Awaiting return call. CSW will continue to follow to assist with d/c planning needs.  Employment status:  Retired Forensic scientist:  Medicare PT  Recommendations:  Not assessed at this time [place] / Referral to community resources:  Other (Comment Required) (residential hospice home)  Patient/Family's Response to care:  Granddaughter feels residential hospice home is needed.   Patient/Family's Understanding of and Emotional Response to Diagnosis, Current Treatment, and Prognosis:  Pt / Granddaughter are aware of pt's medical status. Palliative Care Team met with them today. Pt wants family to stay with him at home but granddaughter feels pt will do better at residential hospice home. Granddaughter feels pt will agree to this plan if a family member can be with him at facility. Family members will continue to talk about residential hospice placement. Pt has a very large family and it isn't clear if all members are on the same page with dc planning. CSW will contact [facility] in the am to check availability and update granddaughter. Family has a list of all residential hospice homes and will review other options.    Emotional Assessment Appearance:  Appears stated age Attitude/Demeanor/Rapport:  Other (cooperative) Affect (typically observed):  Accepting, Appropriate, Pleasant Orientation:  Oriented to Self, Oriented to Place, Oriented to  Time, Oriented to Situation Alcohol / Substance use:  Not Applicable Psych involvement (Current and /or in the community):  No (Comment)  Discharge Needs  Concerns to be addressed:  Discharge Planning Concerns Readmission within the last 30 days:  No Current discharge risk:  None Barriers to Discharge:  No Barriers Identified   Loraine Maple  [phone] 11/08/2016, 2:44 PM

## 2016-11-08 NOTE — Progress Notes (Addendum)
ANTICOAGULATION CONSULT NOTE - Initial Consult  Pharmacy Consult for Enoxaparin Indication: VTE treatment  No Known Allergies  Patient Measurements: Height: '5\' 8"'$  (172.7 cm) Weight: 145 lb (65.8 kg) IBW/kg (Calculated) : 68.4   Vital Signs: Temp: 97.6 F (36.4 C) (02/28 1338) Temp Source: Oral (02/28 1338) BP: 116/79 (02/28 1338) Pulse Rate: 87 (02/28 1338)  Labs:  Recent Labs  11/08/16 0040 11/08/16 0052  HGB 11.2* 13.6  HCT 33.7* 40.0  PLT 135*  --   LABPROT 17.0*  --   INR 1.37  --   CREATININE 0.81 1.00    Estimated Creatinine Clearance: 43 mL/min (by C-G formula based on SCr of 1 mg/dL).   Medical History: Past Medical History:  Diagnosis Date  . Arthritis   . Blind right eye   . Hypertension      Assessment: 36 y/oM with PMH of arthritis, blindness in right eye, HTN who presented [facility] ED on 11/08/2016 with worsening L arm swelling, tightness in chest, R flank pain, nausea, weakness, decreased PO intake, and weight loss. CT of chest revealed lung mass. LUE venous duplex positive for DVT. Pharmacy consulted to dose Lovenox for VTE treatment.   Today, 11/08/16:  Last dose of Lovenox '40mg'$  SQ at 1003  SCr 1 with CrCl ~ 43 ml/min  CBC: Hgb 13.6, Pltc slightly low at 135K  No bleeding reported  Goal of Therapy:  Anti-Xa level 0.6-1 units/ml 4hrs after LMWH dose given Monitor platelets by anticoagulation protocol: Yes   Plan:  Lovenox '1mg'$ /kg ('65mg'$ ) SQ this PM at 1800. Then start Lovenox 1.5 mg/kg ('100mg'$ ) SQ q24h tomorrow AM.  Given focus of comfort measures at this time, have not ordered f/u CBC, SCr at this time.  Monitor for s/sx of bleeding.  Luiz Ochoa 11/08/2016,3:33 PM

## 2016-11-08 NOTE — ED Notes (Signed)
Attempted to call floor.  Will call back in a few minutes.

## 2016-11-08 NOTE — ED Notes (Signed)
Asked pt if he could give a urine sample, but said he's not able to.

## 2016-11-08 NOTE — ED Notes (Signed)
Attempted to get a good consistent pulse ox on pt.  Values varied, most consistently ranged between 90-95% before cutting out.  MD aware of this.

## 2016-11-08 NOTE — ED Notes (Addendum)
Pt is here alone. States he has family that comes in to check on him at times. He is very cachetic appearing. States he is able to ambulate but very weak. States "I have not been to the doctor in over 10 years because I can take care of myself and I know what to take when I am sick." Denies any medical problems other than htn and arthritis. He states his left arm began swelling over 2-3 weeks ago but the swelling got worst over the past week and has moved to his hand. His left arm and hand is grossly edematous and cool to touch. Radial pulse weak along with weak grip. He states "oh it's just arthritis". Denies any shortness of breath, recent falls or injuries to left arm or hand, chest pain, dizziness, or light headedness. His clothing is very dirty and fingernails and personal hygiene is unclean. When asked who helps take care of him he stated "I take care of myself and sometimes my neighbors will come over and see me and bring me something to eat."

## 2016-11-08 NOTE — ED Provider Notes (Signed)
[facility] DEPT Provider Note   CSN: [id number] Arrival date & time: 11/07/16  2305   By signing my name below, I, Thomas Christensen, attest that this documentation has been prepared under the direction and in the presence of Thomas Balls, MD. Electronically Signed: Soijett Christensen, ED Scribe. 11/08/16. 12:27 AM.  History   Chief Complaint No chief complaint on file.   HPI Thomas Christensen is a 83 y.o. male with a PMHx of HTN, arthritis, who presents to the Emergency Department complaining of left arm swelling onset 2-3 weeks ago. Pt reports associated chest tightness x 3-4 weeks and "knot" to right inner thigh. He notes that his chest tightness is the same throughout the day. Pt has not tried any medications for the relief of his symptoms. Pt daughter notes that she last saw the pt several months ago and she didn't notice the swelling to the pt left arm. He denies fever, chills, and any other symptoms.     The history is provided by the patient and a relative. No language interpreter was used.    Past Medical History:  Diagnosis Date  . Arthritis   . Blind right eye   . Hypertension     There are no active problems to display for this patient.   History reviewed. No pertinent surgical history.     Home Medications    Prior to Admission medications   Medication Sig Start Date End Date Taking? Authorizing Provider  guaiFENesin (ROBITUSSIN) 100 MG/5ML liquid Take 200 mg by mouth 3 (three) times daily as needed for cough.   Yes Historical Provider, MD  simethicone (MYLICON) 80 MG chewable tablet Chew 80 mg by mouth every 6 (six) hours as needed for flatulence.   Yes Historical Provider, MD    Family History No family history on file.  Social History Social History  Substance Use Topics  . Smoking status: Never Smoker  . Smokeless tobacco: Current User    Types: Snuff  . Alcohol use No     Allergies   Patient has no known allergies.   Review of Systems Review of  Systems A complete 10 system review of systems was obtained and all systems are negative except as noted in the HPI and PMH.   Physical Exam Updated Vital Signs BP 96/73 (BP Location: Right Arm)   Pulse 87   Temp 97.9 F (36.6 C) (Oral)   Resp 12   Ht '5\' 8"'$  (1.727 m)   Wt 145 lb (65.8 kg)   SpO2 (!) 88%   BMI 22.05 kg/m   Physical Exam  Constitutional: He is oriented to person, place, and time. Vital signs are normal. He appears well-developed. He appears cachectic.  Non-toxic appearance. He does not appear ill. No distress.  Cachetic appearing  HENT:  Head: Normocephalic and atraumatic.  Nose: Nose normal.  Mouth/Throat: No oropharyngeal exudate.  Dry oropharynx  Neck: Normal range of motion. Neck supple. No tracheal deviation, no edema, no erythema and normal range of motion present. No thyroid mass and no thyromegaly present.  Cardiovascular: Normal rate, regular rhythm, S1 normal, S2 normal, normal heart sounds, intact distal pulses and normal pulses.  Exam reveals no gallop and no friction rub.   No murmur heard. Pulmonary/Chest: Effort normal and breath sounds normal. No respiratory distress. He has no wheezes. He has no rhonchi. He has no rales.  Abdominal: Soft. Normal appearance and bowel sounds are normal. He exhibits no distension, no ascites and no mass. There is no hepatosplenomegaly.  There is no tenderness. There is no rebound, no guarding and no CVA tenderness.  Musculoskeletal: Normal range of motion. He exhibits no tenderness.       Left upper arm: He exhibits edema.  LUE lymphedema. Nl pulses and sensation to LUE. Right thigh lipoma.   Lymphadenopathy:    He has no cervical adenopathy.  Neurological: He is alert and oriented to person, place, and time. He has normal strength. No cranial nerve deficit or sensory deficit.  Skin: Skin is warm, dry and intact. No petechiae and no rash noted. He is not diaphoretic. No erythema. No pallor.  Nursing note and vitals  reviewed.    ED Treatments / Results  DIAGNOSTIC STUDIES: Oxygen Saturation is 100% on RA, nl by my interpretation.    COORDINATION OF CARE: 12:24 AM Discussed treatment plan with pt at bedside which includes CT chest/abd/pel for dissections, labs, UA, EKG, and pt agreed to plan.   Labs (all labs ordered are listed, but only abnormal results are displayed) Labs Reviewed  CBC WITH DIFFERENTIAL/PLATELET - Abnormal; Notable for the following:       Result Value   WBC 13.7 (*)    Hemoglobin 11.2 (*)    HCT 33.7 (*)    RDW 16.2 (*)    Platelets 135 (*)    Neutro Abs 12.5 (*)    All other components within normal limits  COMPREHENSIVE METABOLIC PANEL - Abnormal; Notable for the following:    Chloride 99 (*)    BUN 33 (*)    Calcium 11.9 (*)    Albumin 3.1 (*)    AST 45 (*)    Total Bilirubin 1.4 (*)    All other components within normal limits  PROTIME-INR - Abnormal; Notable for the following:    Prothrombin Time 17.0 (*)    All other components within normal limits  I-STAT CHEM 8, ED - Abnormal; Notable for the following:    Chloride 96 (*)    BUN 32 (*)    Calcium, Ion 1.44 (*)    All other components within normal limits  URINE CULTURE  LIPASE, BLOOD  URINALYSIS, ROUTINE W REFLEX MICROSCOPIC  I-STAT TROPOININ, ED    EKG  EKG Interpretation  Date/Time:  Wednesday November 08 2016 01:09:09 EST Ventricular Rate:  96 PR Interval:    QRS Duration: 62 QT Interval:  331 QTC Calculation: 419 R Axis:   74 Text Interpretation:  Normal sinus rhythm Premature atrial complexes PACs are new Confirmed by Glynn Octave [id number]) on 11/08/2016 1:58:15 AM       Radiology Ct Angio Chest/abd/pel For Dissection W And/or Wo Contrast  Result Date: 11/08/2016 CLINICAL DATA:  Chest and abdominal pain radiating to the back. EXAM: CT ANGIOGRAPHY CHEST, ABDOMEN AND PELVIS TECHNIQUE: Multidetector CT imaging through the chest, abdomen and pelvis was performed using the standard  protocol during bolus administration of intravenous contrast. Multiplanar reconstructed images and MIPs were obtained and reviewed to evaluate the vascular anatomy. CONTRAST:  100 cc Isovue 370 IV COMPARISON:  Chest radiograph 07/21/2008.  No recent comparison. FINDINGS: CTA CHEST FINDINGS Cardiovascular: Diffuse thoracic aortic tortuosity with mild atherosclerosis. Mild aneurysmal dilatation of the ascending segment measuring 4.1 cm maximal dimension. There is no dissection. No central pulmonary embolus. Large right lung mass causes compression and likely invasion of the right pulmonary veins. Decreased density of the blood pool consistent with anemia. Multiple mediastinal collaterals. Mediastinum/Nodes: Large heterogeneous right hilar mass near completely filling the right middle lobe. Discretely separate  hilar adenopathy is not visualized. No definite left hilar adenopathy. No enlarged mediastinal lymph nodes. Patulous upper esophagus. Lungs/Pleura: Advanced emphysema. Large right middle lobe mass measures 10 x 9.3 x 10 cm, heterogeneous and enhancing. This mass causes compression of the right upper lobe bronchus. Advanced emphysema. There is a moderate-sized right pleural effusion with internal loculations/enhancement concerning for malignant effusion. Adjacent atelectasis in the right lower lobe. No additional nodule or pulmonary mass. Scattered mucus in the trachea and right lower lobe bronchus. Musculoskeletal: No blastic or definite destructive lytic lesions. Paucity of body fat suggesting cachexia. Enhancing subcutaneous nodule posteriorly measures 2.3 cm superficial to T9. There is enhancing left axillary nodule, may be lymph node or subcutaneous metastasis. Review of the MIP images confirms the above findings. CTA ABDOMEN AND PELVIS FINDINGS VASCULAR Aorta: Diffuse tortuosity and mild atherosclerosis. No aneurysm or dissection. Celiac: Patent without evidence of aneurysm, dissection, vasculitis or  significant stenosis. SMA: Patent without evidence of aneurysm, dissection, vasculitis or significant stenosis. Renals: Both renal arteries are patent without evidence of aneurysm, dissection, vasculitis, fibromuscular dysplasia or significant stenosis. IMA: Patent without evidence of aneurysm, dissection, vasculitis or significant stenosis. Inflow: Tortuous with atherosclerosis. Both common iliac arteries measures 16 mm greatest dimension. Veins: Not well assessed on this arterial phase exam. Review of the MIP images confirms the above findings. NON-VASCULAR Hepatobiliary: Limited assessment for focal lesion given arterial phase imaging. Enhancing lesion in the inferior right lobe measures 2 cm. Gallbladder physiologically distended, no calcified stone. No biliary dilatation. Pancreas: Not well assessed due to paucity of intra-abdominal fat, atrophic parenchyma. No ductal dilatation or evidence of inflammation. Spleen: Small in size, not well evaluated. Adrenals/Urinary Tract: Multiple bilateral renal cysts. Question of enhancing cortical lesion in the right kidney measures 14 x 10 mm, image 103 series 6, versus pararenal lesion. No hydronephrosis. Adrenal glands are not well-defined. Bladder is physiologically distended. Stomach/Bowel: Not well evaluated given paucity of intra-abdominal fat and lack of enteric contrast. Soft tissue density in the pelvis is likely decompressed bowel, but not well-defined. Lymphatic: Enhancing subcutaneous nodules superficial to both gluteal muscles, largest on the right measures 2.4 cm. Limited assessment for intraabdominal adenopathy given technique and paucity of body fat. Probable enhancing left retroperitoneal nodes. Small enhancing nodule in the left pericolic gutter. Reproductive: Heterogeneous prostate gland. Other: Generalized paucity of body fat consistent with cachexia. Musculoskeletal: Enhancing lesion within or adjacent to the left iliopsoas muscle, suboptimally  defined. Smaller enhancing lesion adjacent to the right iliopsoas muscle. Scoliosis and multilevel degenerative change throughout spine. Diffusely decreased bone mineral density. No blastic or definite destructive lytic lesions. Review of the MIP images confirms the above findings. IMPRESSION: 1. Large 10 cm right middle lobe mass compressing or invading the right inferior pulmonary veins. Moderate right pleural effusion, with internal all loculations concerning for malignant effusion. Favor primary lung malignancy given presence of advanced emphysema. Metastatic disease is considered less likely. 2. Enhancing lesion abuts the medial right kidney may be a primary renal lesion, which would be concerning for primary malignancy, versus pararenal metastasis. 3. Multifocal enhancing nodules in the subcutaneous tissues, adjacent to the iliopsoas musculature, left retroperitoneum, left pericolic gutter, concerning for metastatic disease. Enhancing lesion in the inferior right lobe of the liver. 4. No aortic dissection. Diffuse thoracoabdominal aortic tortuosity. Aneurysmal dilatation of the ascending aorta, maximal dimension 4.1 cm. Recommend annual imaging followup by CTA or MRA pending patient condition. 5. Advanced emphysema. 6. Cachexia. Electronically Signed   By: Fonnie Birkenhead.D.  On: 11/08/2016 02:38    Procedures Procedures (including critical care time)  Medications Ordered in ED Medications  sodium chloride 0.9 % bolus 1,000 mL (1,000 mLs Intravenous New Bag/Given 11/08/16 0112)  iopamidol (ISOVUE-370) 76 % injection 100 mL (100 mLs Intravenous Contrast Given 11/08/16 0128)     Initial Impression / Assessment and Plan / ED Course  I have reviewed the triage vital signs and the nursing notes.  Pertinent labs & imaging results that were available during my care of the patient were reviewed by me and considered in my medical decision making (see chart for details).     Patient presents to the  ED for chest and abdominal pain.  He does not provide a great history given his age.  Will obtian labs and perform CT scan for evaluation.  Patient may have gb pathology, pancreatitis vs AAA.  Labs and urine pending as well.  His L arm swelling appears chronic and related to lymphedema.  Will continue to observe in the ED.   2:53 AM CT reveals likely cancer in the lungs, liver, kidney, lymph nodes.  Patient informed.  Mass in the lung is evading into the pulmonary vessels.  He will require admission for this reason.  Will page Dr. Tamala Julian for admission.    Final Clinical Impressions(s) / ED Diagnoses   Final diagnoses:  None    New Prescriptions New Prescriptions   No medications on file   I personally performed the services described in this documentation, which was scribed in my presence. The recorded information has been reviewed and is accurate.      Thomas Balls, MD 11/08/16 [phone]

## 2016-11-08 NOTE — Progress Notes (Signed)
*  Preliminary Results* Left upper extremity venous duplex completed. Left upper extremity is positive for extensive occlusive acute deep and superficial vein thrombosis involving the left internal jugular vein, left subclavian vein, left brachial veins, left radial veins, left ulnar veins, left basilic vein, and left cephalic vein. There is no evidence of thrombosis in the right subclavian vein, however there is evidence of rouleaux flow.  Preliminary results discussed with Dr. Tyrell Antonio.  11/08/2016 2:26 PM  Maudry Mayhew, BS, RVT, RDCS, RDMS

## 2016-11-09 DIAGNOSIS — L8990 Pressure ulcer of unspecified site, unspecified stage: Secondary | ICD-10-CM | POA: Insufficient documentation

## 2016-11-09 LAB — URINE CULTURE: CULTURE: NO GROWTH

## 2016-11-09 MED ORDER — BISACODYL 5 MG PO TBEC: 5.0000 mg | DELAYED_RELEASE_TABLET | Freq: Every day | ORAL | 0 refills | Status: AC

## 2016-11-09 MED ORDER — ACETAMINOPHEN 325 MG PO TABS: 650.0000 mg | ORAL_TABLET | Freq: Four times a day (QID) | ORAL | 0 refills | Status: AC | PRN

## 2016-11-09 MED ORDER — APIXABAN 5 MG PO TABS: 10.0000 mg | ORAL_TABLET | Freq: Two times a day (BID) | ORAL | Status: DC

## 2016-11-09 MED ORDER — APIXABAN 5 MG PO TABS: 5.0000 mg | ORAL_TABLET | Freq: Two times a day (BID) | ORAL | Status: DC

## 2016-11-09 MED ORDER — APIXABAN 5 MG PO TABS: 10.0000 mg | ORAL_TABLET | Freq: Two times a day (BID) | ORAL | 0 refills | Status: AC

## 2016-11-09 MED ORDER — ENSURE ENLIVE PO LIQD: 237.0000 mL | Freq: Three times a day (TID) | ORAL | 12 refills | Status: AC

## 2016-11-09 MED ORDER — APIXABAN 5 MG PO TABS: 5.0000 mg | ORAL_TABLET | Freq: Two times a day (BID) | ORAL | 0 refills | Status: AC

## 2016-11-09 MED ORDER — HYDROCODONE-ACETAMINOPHEN 5-325 MG PO TABS: 1.0000 | ORAL_TABLET | ORAL | 0 refills | Status: AC | PRN

## 2016-11-09 MED ORDER — IPRATROPIUM-ALBUTEROL 0.5-2.5 (3) MG/3ML IN SOLN: 3.0000 mL | Freq: Three times a day (TID) | RESPIRATORY_TRACT | 0 refills | Status: AC

## 2016-11-09 NOTE — Progress Notes (Signed)
CSW assisting with d/c planning. Referral provided to Erling Conte, LCSW, liaison from [facility], at granddaughter's Helene Kelp [phone] ) request. CSW will be contacted once more info is available.   Werner Lean LCSW [phone]

## 2016-11-09 NOTE — Progress Notes (Signed)
Patient d/c to [facility] via [facility]. Stable at d/c.

## 2016-11-09 NOTE — Progress Notes (Signed)
ANTICOAGULATION CONSULT NOTE - Follow Up Consult  Pharmacy Consult for lovenox --> Eliquis Indication: new DVT  No Known Allergies  Patient Measurements: Height: '5\' 8"'$  (172.7 cm) Weight: 145 lb (65.8 kg) IBW/kg (Calculated) : 68.4 Heparin Dosing Weight:  Vital Signs: Temp: 98.2 F (36.8 C) (03/01 1200) Temp Source: Oral (03/01 1200) BP: 132/68 (03/01 1200) Pulse Rate: 98 (03/01 1200)  Labs:  Recent Labs  11/08/16 0040 11/08/16 0052  HGB 11.2* 13.6  HCT 33.7* 40.0  PLT 135*  --   LABPROT 17.0*  --   INR 1.37  --   CREATININE 0.81 1.00    Estimated Creatinine Clearance: 43 mL/min (by C-G formula based on SCr of 1 mg/dL).   Assessment: 39 y/oM with PMH of arthritis, blindness in right eye, HTN who presented to [facility] ED on 11/08/2016 with worsening L arm swelling, tightness in chest, R flank pain, nausea, weakness, decreased PO intake, and weight loss. CT of chest revealed lung mass. LUE venous duplex positive for DVT. Lovenox was started on admission --> to change to Eliquis on 3/1    Plan:  - Eliquis 10 mg PO twice daily for 7 days, then 5 mg twice daily --> start on 3/2 at 0800 since lovenox 100 mg was given at 0800 this morning - monitor for s/s bleeding  Thomas Christensen P 11/09/2016,12:39 PM

## 2016-11-09 NOTE — Progress Notes (Addendum)
CSW assisting with dc planning. Pt / family have accepted bed at [facility] for today. MD has been notified. CSW will send dc summary to facility once it is available.  Werner Lean LCSW [phone]  14:13 Pt is ready to dc to [facility]. Pt . Family are in agreement with this plan. [facility] transport is required. Medical necessity form completed. Scripts included in d/c packet. D/C summary sent to facility for review. # for report provided to nsg.  Werner Lean LCSW [phone]

## 2016-11-09 NOTE — Discharge Summary (Addendum)
Physician Discharge Summary  Julio Zappia CHE:[id number] DOB: [birth year] DOA: 11/07/2016  PCP: No PCP Per Patient  Admit date: 11/07/2016 Discharge date: 11/09/2016  Admitted From: Home  Disposition: Residential Hospice)     Discharge Condition: stable.  CODE STATUS: DNR Diet recommendation: Regular  Brief/Interim Summary: Thomas Johnsonis a 83 y.o.malewith medical history significant of HTN, blind in right eye, and arthritis; who presents complaints of progressively worsening left arm swelling for the last 3 weeks.He reports associated symptoms of tightness in his chest, right flank pain, nausea, generalized weakness, decreased oral intake, and weight loss of unknown amount. Patient reports trying to take prune juice and Gas-xwithout relief of symptoms. He reports intermittently forcing herself to vomit with some mild relief of symptoms. At baseline patient lives alone and was previously able to complete all of his ADLs. However,over the last week or more he's gotten to the point which he was unable to walk or care for himself like normal at home. Family members normally come to check on him once every 1-2 weeks.Family members had come to visit him today and seen the patient was not able to care for himself along with the left arm swelling and had called EMS.   Assessment & Plan:   Principal Problem:   Failure to thrive in adult Active Problems:   Mass of right lung   Leukocytosis   Left arm swelling   Anemia   Hypercalcemia   1-Lung Mass, pleural effusion; probably lung cancer.  Palliative care consulted. Option for thoracentesis was provide to patient and family. They had discussion with palliative care team, goal is for comfort care. Plan is for residential hospice facility.   2-Cachetic.  Ensure.   3-left upper extremity DVT;  Received Lovenox for 2 days. He will be transition to eliquis starting 3-2. Doppler; Left upper extremity is positive for extensive  occlusive acute deep and superficial vein thrombosis involving the left internal jugular vein, left subclavian vein, left brachial veins, left radial veins, left ulnar veins, left basilic vein, and left cephalic vein. There is no evidence of thrombosis in the right subclavian vein, however there is evidence of rouleaux flow.  Stage I pressure ulcer, sacrum. Local care  Discharge Diagnoses:  Principal Problem:   Failure to thrive in adult Active Problems:   Mass of right lung   Leukocytosis   Left arm swelling   Anemia   Hypercalcemia   Pressure injury of skin    Discharge Instructions  Discharge Instructions    Diet - low sodium heart healthy    Complete by:  As directed    Increase activity slowly    Complete by:  As directed      Allergies as of 11/09/2016   No Known Allergies     Medication List    TAKE these medications   acetaminophen 325 MG tablet Commonly known as:  TYLENOL Take 2 tablets (650 mg total) by mouth every 6 (six) hours as needed for mild pain (or Fever >/= 101).   apixaban 5 MG Tabs tablet Commonly known as:  ELIQUIS Take 2 tablets (10 mg total) by mouth 2 (two) times daily. Start taking on:  11/10/2016   apixaban 5 MG Tabs tablet Commonly known as:  ELIQUIS Take 1 tablet (5 mg total) by mouth 2 (two) times daily. Start taking on:  11/17/2016   bisacodyl 5 MG EC tablet Commonly known as:  DULCOLAX Take 1 tablet (5 mg total) by mouth at bedtime.   feeding supplement (ENSURE  ENLIVE) Liqd Take 237 mLs by mouth 3 (three) times daily between meals.   guaiFENesin 100 MG/5ML liquid Commonly known as:  ROBITUSSIN Take 200 mg by mouth 3 (three) times daily as needed for cough.   HYDROcodone-acetaminophen 5-325 MG tablet Commonly known as:  NORCO/VICODIN Take 1-2 tablets by mouth every 4 (four) hours as needed for moderate pain.   ipratropium-albuterol 0.5-2.5 (3) MG/3ML Soln Commonly known as:  DUONEB Take 3 mLs by nebulization 3 (three) times  daily.   simethicone 80 MG chewable tablet Commonly known as:  MYLICON Chew 80 mg by mouth every 6 (six) hours as needed for flatulence.       No Known Allergies  Consultations:  Palliative care team.    Procedures/Studies: Ct Angio Chest/abd/pel For Dissection W And/or Wo Contrast  Result Date: 11/08/2016 CLINICAL DATA:  Chest and abdominal pain radiating to the back. EXAM: CT ANGIOGRAPHY CHEST, ABDOMEN AND PELVIS TECHNIQUE: Multidetector CT imaging through the chest, abdomen and pelvis was performed using the standard protocol during bolus administration of intravenous contrast. Multiplanar reconstructed images and MIPs were obtained and reviewed to evaluate the vascular anatomy. CONTRAST:  100 cc Isovue 370 IV COMPARISON:  Chest radiograph 07/21/2008.  No recent comparison. FINDINGS: CTA CHEST FINDINGS Cardiovascular: Diffuse thoracic aortic tortuosity with mild atherosclerosis. Mild aneurysmal dilatation of the ascending segment measuring 4.1 cm maximal dimension. There is no dissection. No central pulmonary embolus. Large right lung mass causes compression and likely invasion of the right pulmonary veins. Decreased density of the blood pool consistent with anemia. Multiple mediastinal collaterals. Mediastinum/Nodes: Large heterogeneous right hilar mass near completely filling the right middle lobe. Discretely separate hilar adenopathy is not visualized. No definite left hilar adenopathy. No enlarged mediastinal lymph nodes. Patulous upper esophagus. Lungs/Pleura: Advanced emphysema. Large right middle lobe mass measures 10 x 9.3 x 10 cm, heterogeneous and enhancing. This mass causes compression of the right upper lobe bronchus. Advanced emphysema. There is a moderate-sized right pleural effusion with internal loculations/enhancement concerning for malignant effusion. Adjacent atelectasis in the right lower lobe. No additional nodule or pulmonary mass. Scattered mucus in the trachea and right  lower lobe bronchus. Musculoskeletal: No blastic or definite destructive lytic lesions. Paucity of body fat suggesting cachexia. Enhancing subcutaneous nodule posteriorly measures 2.3 cm superficial to T9. There is enhancing left axillary nodule, may be lymph node or subcutaneous metastasis. Review of the MIP images confirms the above findings. CTA ABDOMEN AND PELVIS FINDINGS VASCULAR Aorta: Diffuse tortuosity and mild atherosclerosis. No aneurysm or dissection. Celiac: Patent without evidence of aneurysm, dissection, vasculitis or significant stenosis. SMA: Patent without evidence of aneurysm, dissection, vasculitis or significant stenosis. Renals: Both renal arteries are patent without evidence of aneurysm, dissection, vasculitis, fibromuscular dysplasia or significant stenosis. IMA: Patent without evidence of aneurysm, dissection, vasculitis or significant stenosis. Inflow: Tortuous with atherosclerosis. Both common iliac arteries measures 16 mm greatest dimension. Veins: Not well assessed on this arterial phase exam. Review of the MIP images confirms the above findings. NON-VASCULAR Hepatobiliary: Limited assessment for focal lesion given arterial phase imaging. Enhancing lesion in the inferior right lobe measures 2 cm. Gallbladder physiologically distended, no calcified stone. No biliary dilatation. Pancreas: Not well assessed due to paucity of intra-abdominal fat, atrophic parenchyma. No ductal dilatation or evidence of inflammation. Spleen: Small in size, not well evaluated. Adrenals/Urinary Tract: Multiple bilateral renal cysts. Question of enhancing cortical lesion in the right kidney measures 14 x 10 mm, image 103 series 6, versus pararenal lesion. No hydronephrosis. Adrenal glands  are not well-defined. Bladder is physiologically distended. Stomach/Bowel: Not well evaluated given paucity of intra-abdominal fat and lack of enteric contrast. Soft tissue density in the pelvis is likely decompressed bowel, but  not well-defined. Lymphatic: Enhancing subcutaneous nodules superficial to both gluteal muscles, largest on the right measures 2.4 cm. Limited assessment for intraabdominal adenopathy given technique and paucity of body fat. Probable enhancing left retroperitoneal nodes. Small enhancing nodule in the left pericolic gutter. Reproductive: Heterogeneous prostate gland. Other: Generalized paucity of body fat consistent with cachexia. Musculoskeletal: Enhancing lesion within or adjacent to the left iliopsoas muscle, suboptimally defined. Smaller enhancing lesion adjacent to the right iliopsoas muscle. Scoliosis and multilevel degenerative change throughout spine. Diffusely decreased bone mineral density. No blastic or definite destructive lytic lesions. Review of the MIP images confirms the above findings. IMPRESSION: 1. Large 10 cm right middle lobe mass compressing or invading the right inferior pulmonary veins. Moderate right pleural effusion, with internal all loculations concerning for malignant effusion. Favor primary lung malignancy given presence of advanced emphysema. Metastatic disease is considered less likely. 2. Enhancing lesion abuts the medial right kidney may be a primary renal lesion, which would be concerning for primary malignancy, versus pararenal metastasis. 3. Multifocal enhancing nodules in the subcutaneous tissues, adjacent to the iliopsoas musculature, left retroperitoneum, left pericolic gutter, concerning for metastatic disease. Enhancing lesion in the inferior right lobe of the liver. 4. No aortic dissection. Diffuse thoracoabdominal aortic tortuosity. Aneurysmal dilatation of the ascending aorta, maximal dimension 4.1 cm. Recommend annual imaging followup by CTA or MRA pending patient condition. 5. Advanced emphysema. 6. Cachexia. Electronically Signed   By: Jeb Levering M.D.   On: 11/08/2016 02:38    Subjective: He was sleepy, wake, denies pain.   Discharge Exam: Vitals:    11/09/16 0640 11/09/16 1200  BP: 139/64 132/68  Pulse: (!) 102 98  Resp: 18 18  Temp: 98.9 F (37.2 C) 98.2 F (36.8 C)   Vitals:   11/08/16 2100 11/09/16 0640 11/09/16 0751 11/09/16 1200  BP: 106/60 139/64  132/68  Pulse: (!) 108 (!) 102  98  Resp: '16 18  18  '$ Temp: 98.8 F (37.1 C) 98.9 F (37.2 C)  98.2 F (36.8 C)  TempSrc: Oral Oral  Oral  SpO2: 99% 100% 96% 96%  Weight:      Height:        General: Pt is alert, awake, not in acute distress Cardiovascular: RRR, S1/S2 +, no rubs, no gallops Respiratory: CTA bilaterally, no wheezing, no rhonchi Abdominal: Soft, NT, ND, bowel sounds + Extremities: no cyanosis. Left arm with edema    The results of significant diagnostics from this hospitalization (including imaging, microbiology, ancillary and laboratory) are listed below for reference.     Microbiology: Recent Results (from the past 240 hour(s))  Urine culture     Status: None   Collection Time: 11/08/16  4:18 AM  Result Value Ref Range Status   Specimen Description URINE, CLEAN CATCH  Final   Special Requests NONE  Final   Culture   Final    NO GROWTH Performed at [facility], [address] [place], [place] [address]    Report Status 11/09/2016 FINAL  Final     Labs: BNP (last 3 results) No results for input(s): BNP in the last 8760 hours. Basic Metabolic Panel:  Recent Labs Lab 11/08/16 0040 11/08/16 0052  NA 136 135  K 4.2 4.0  CL 99* 96*  CO2 26  --  GLUCOSE 95 93  BUN 33* 32*  CREATININE 0.81 1.00  CALCIUM 11.9*  --    Liver Function Tests:  Recent Labs Lab 11/08/16 0040  AST 45*  ALT 21  ALKPHOS 123  BILITOT 1.4*  PROT 7.2  ALBUMIN 3.1*    Recent Labs Lab 11/08/16 0040  LIPASE 16   No results for input(s): AMMONIA in the last 168 hours. CBC:  Recent Labs Lab 11/08/16 0040 11/08/16 0052  WBC 13.7*  --   NEUTROABS 12.5*  --   HGB 11.2* 13.6  HCT 33.7* 40.0  MCV 79.9  --   PLT 135*  --    Cardiac  Enzymes: No results for input(s): CKTOTAL, CKMB, CKMBINDEX, TROPONINI in the last 168 hours. BNP: Invalid input(s): POCBNP CBG: No results for input(s): GLUCAP in the last 168 hours. D-Dimer No results for input(s): DDIMER in the last 72 hours. Hgb A1c No results for input(s): HGBA1C in the last 72 hours. Lipid Profile No results for input(s): CHOL, HDL, LDLCALC, TRIG, CHOLHDL, LDLDIRECT in the last 72 hours. Thyroid function studies No results for input(s): TSH, T4TOTAL, T3FREE, THYROIDAB in the last 72 hours.  Invalid input(s): FREET3 Anemia work up No results for input(s): VITAMINB12, FOLATE, FERRITIN, TIBC, IRON, RETICCTPCT in the last 72 hours. Urinalysis    Component Value Date/Time   COLORURINE YELLOW 11/08/2016 0418   APPEARANCEUR CLEAR 11/08/2016 0418   LABSPEC >1.046 (H) 11/08/2016 0418   PHURINE 5.0 11/08/2016 0418   GLUCOSEU NEGATIVE 11/08/2016 0418   HGBUR NEGATIVE 11/08/2016 0418   BILIRUBINUR NEGATIVE 11/08/2016 0418   KETONESUR 20 (A) 11/08/2016 0418   PROTEINUR NEGATIVE 11/08/2016 0418   UROBILINOGEN 0.2 07/21/2008 1344   NITRITE NEGATIVE 11/08/2016 0418   LEUKOCYTESUR NEGATIVE 11/08/2016 0418   Sepsis Labs Invalid input(s): PROCALCITONIN,  WBC,  LACTICIDVEN Microbiology Recent Results (from the past 240 hour(s))  Urine culture     Status: None   Collection Time: 11/08/16  4:18 AM  Result Value Ref Range Status   Specimen Description URINE, CLEAN CATCH  Final   Special Requests NONE  Final   Culture   Final    NO GROWTH Performed at [facility], [place] [address], [place], [place] [address]    Report Status 11/09/2016 FINAL  Final     Time coordinating discharge: Over 30 minutes  SIGNED:   Elmarie Shiley, MD  [facility] 11/09/2016, 12:59 PM Pager   If 7PM-7AM, please contact night-coverage [email or web address] Password [email or web address]

## 2016-11-09 NOTE — Consult Note (Signed)
[facility] [employer] Received request from [facility] for family interest in [facility]. Chart reviewed and eligibility confirmed. Met with patient and family to complete paper work for transfer today. CSW aware.   Please fax discharge summary to [phone].  RN Please call report to [phone].   Thank you, Erling Conte, LCSW [phone]

## 2016-11-09 NOTE — Progress Notes (Signed)
Nutrition Brief Note  Pt identified as at nutrition risk on the Malnutrition Screen Tool.  Chart reviewed. Pt now transitioning to comfort care.  No nutrition interventions warranted at this time.  Please rconsult as needed.   Clayton Bibles, MS, RD, LDN Pager: [phone] After Hours Pager: [phone]

## 2016-11-09 NOTE — Progress Notes (Signed)
Report given to [facility] at [facility]. All questions answered appropriately.

## 2016-12-10 DEATH — deceased

## 2017-09-04 IMAGING — CT CT ANGIO CHEST-ABD-PELV FOR DISSECTION W/ AND WO/W CM
2 of 7 series · 12 of 46 positions shown, 14 images · IV contrast (ISOVUE 370)
Comparison: Chest radiograph 07/21/2008.  No recent comparison.

CLINICAL DATA: Chest and abdominal pain radiating to the back.

EXAM:
CT ANGIOGRAPHY CHEST, ABDOMEN AND PELVIS
TECHNIQUE: Multidetector CT imaging through the chest, abdomen and pelvis was
performed using the standard protocol during bolus administration of
intravenous contrast. Multiplanar reconstructed images and MIPs were
obtained and reviewed to evaluate the vascular anatomy.
CONTRAST:  100 cc Isovue 370 IV

[Series 6: arterial 3.0 b30f · axial · arterial · 0.68mm/px · z∈[+1138,+1612]mm · 9 of 182 slices shown, 11 images]
[im 12/182  soft-tissue]
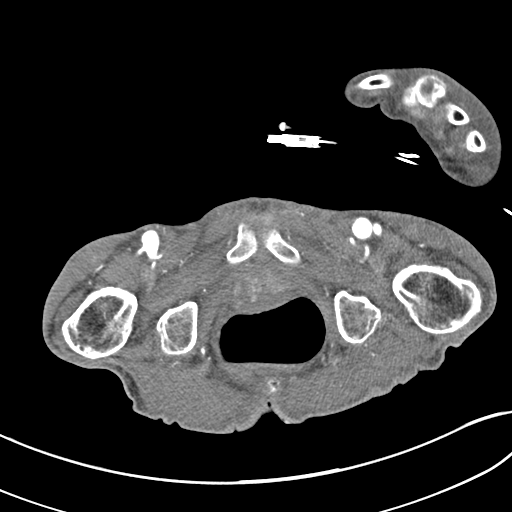
[im 12/182  bone]
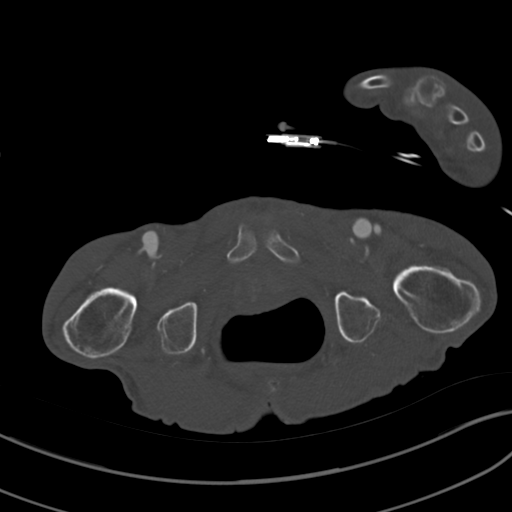
[im 34/182  soft-tissue]
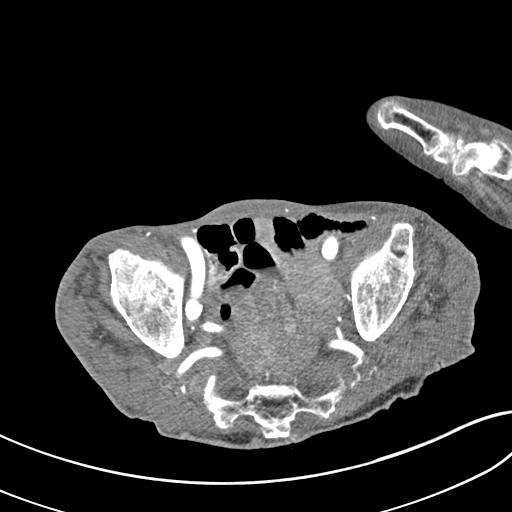
[im 57/182  soft-tissue]
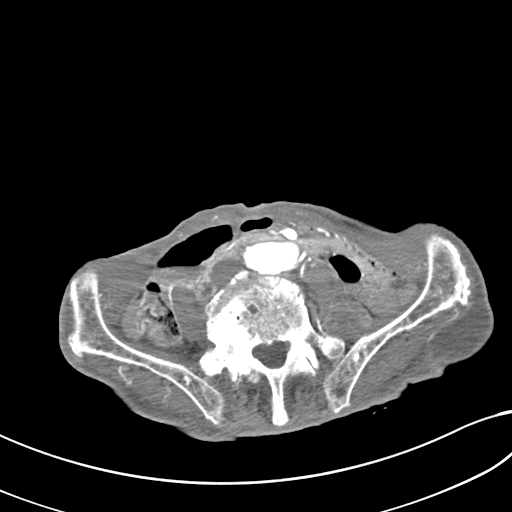
[im 68/182  soft-tissue]
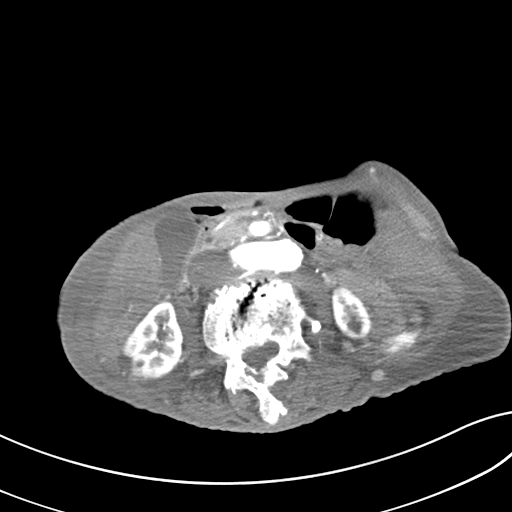
[im 91/182  soft-tissue]
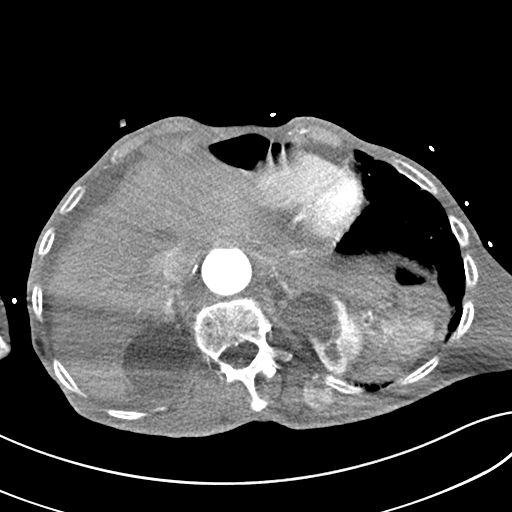
[im 114/182  soft-tissue]
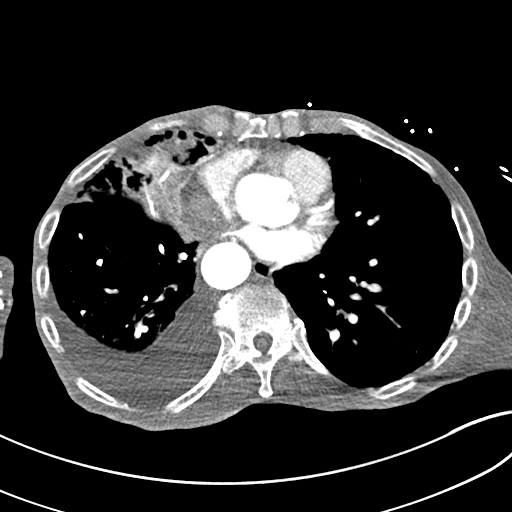
[im 125/182  soft-tissue]
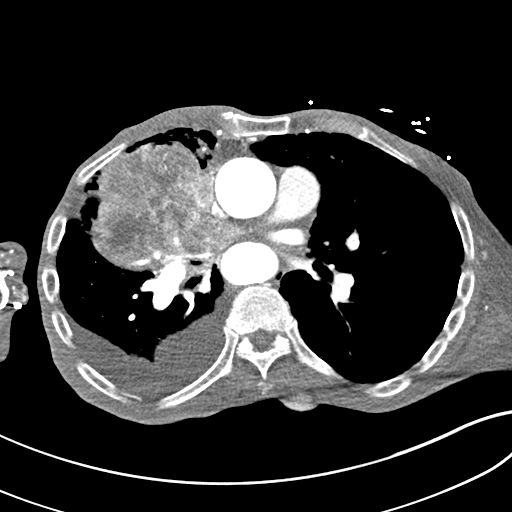
[im 148/182  soft-tissue]
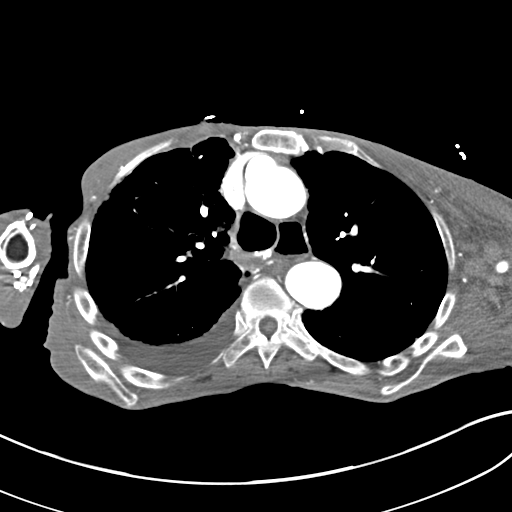
[im 170/182  soft-tissue]
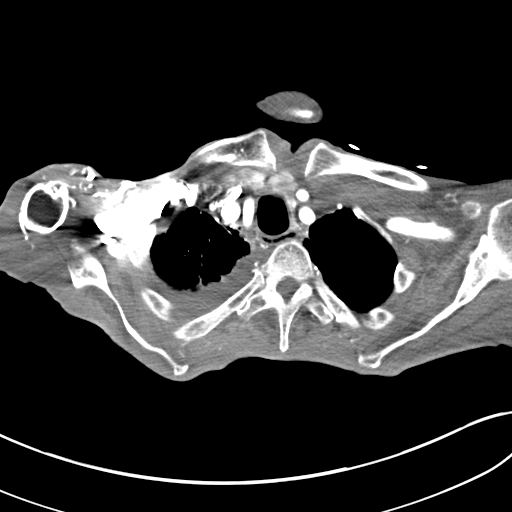
[im 170/182  bone]
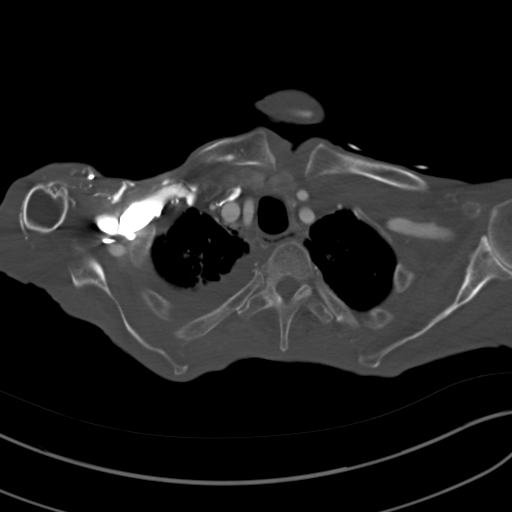

[Series 8: coronal mpr · coronal · 0.70mm/px · 3 of 116 slices shown]
[im 29/116  soft-tissue]
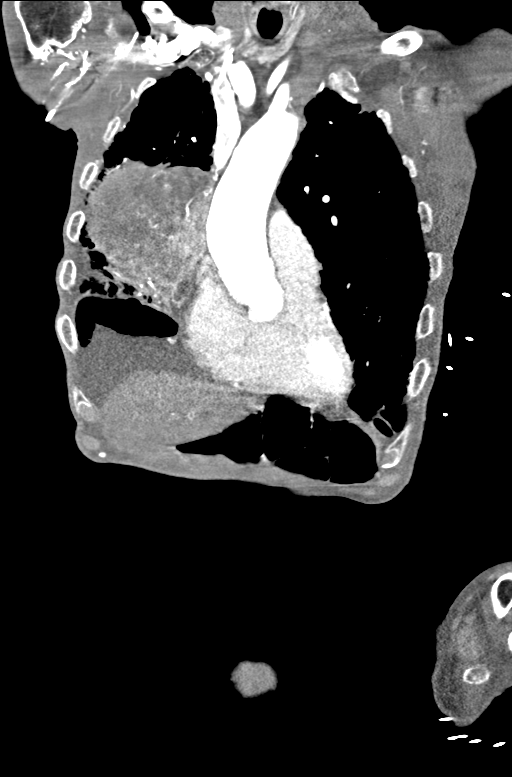
[im 58/116  soft-tissue]
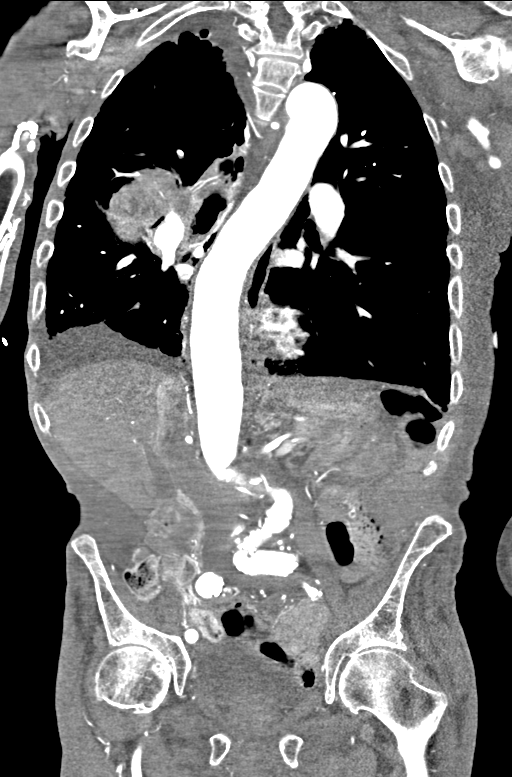
[im 87/116  soft-tissue]
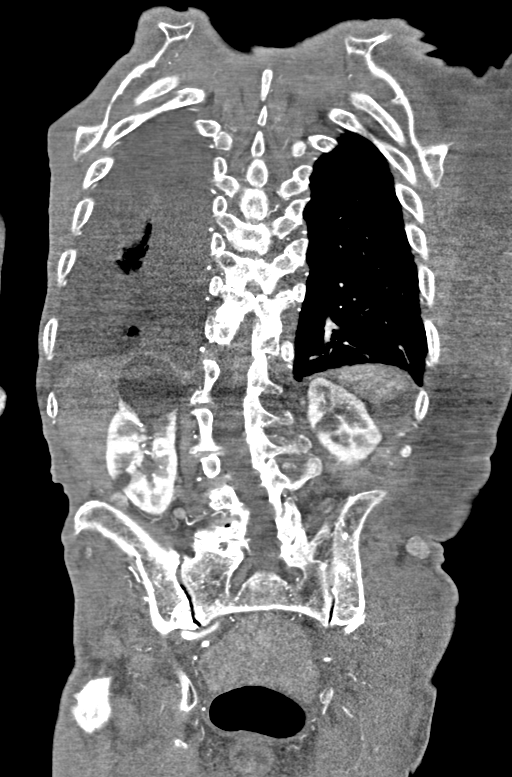

[12 of 46 positions shown; findings below may reference images not displayed]

FINDINGS: CTA CHEST FINDINGS

Cardiovascular: Diffuse thoracic aortic tortuosity with mild
atherosclerosis. Mild aneurysmal dilatation of the ascending segment
measuring 4.1 cm maximal dimension. There is no dissection. No
central pulmonary embolus. Large right lung mass causes compression
and likely invasion of the right pulmonary veins. Decreased density
of the blood pool consistent with anemia. Multiple mediastinal
collaterals.

Mediastinum/Nodes: Large heterogeneous right hilar mass near
completely filling the right middle lobe. Discretely separate hilar
adenopathy is not visualized. No definite left hilar adenopathy. No
enlarged mediastinal lymph nodes. Patulous upper esophagus.

Lungs/Pleura: Advanced emphysema. Large right middle lobe mass
measures 10 x 9.3 x 10 cm, heterogeneous and enhancing. This mass
causes compression of the right upper lobe bronchus. Advanced
emphysema. There is a moderate-sized right pleural effusion with
internal loculations/enhancement concerning for malignant effusion.
Adjacent atelectasis in the right lower lobe. No additional nodule
or pulmonary mass. Scattered mucus in the trachea and right lower
lobe bronchus.

Musculoskeletal: No blastic or definite destructive lytic lesions.
Paucity of body fat suggesting cachexia. Enhancing subcutaneous
nodule posteriorly measures 2.3 cm superficial to T9. There is
enhancing left axillary nodule, may be lymph node or subcutaneous
metastasis.

Review of the MIP images confirms the above findings.

CTA ABDOMEN AND PELVIS FINDINGS

VASCULAR

Aorta: Diffuse tortuosity and mild atherosclerosis. No aneurysm or
dissection.

Celiac: Patent without evidence of aneurysm, dissection, vasculitis
or significant stenosis.

SMA: Patent without evidence of aneurysm, dissection, vasculitis or
significant stenosis.

Renals: Both renal arteries are patent without evidence of aneurysm,
dissection, vasculitis, fibromuscular dysplasia or significant
stenosis.

IMA: Patent without evidence of aneurysm, dissection, vasculitis or
significant stenosis.

Inflow: Tortuous with atherosclerosis. Both common iliac arteries
measures 16 mm greatest dimension.

Veins: Not well assessed on this arterial phase exam.

Review of the MIP images confirms the above findings.

NON-VASCULAR

Hepatobiliary: Limited assessment for focal lesion given arterial
phase imaging. Enhancing lesion in the inferior right lobe measures
2 cm. Gallbladder physiologically distended, no calcified stone. No
biliary dilatation.

Pancreas: Not well assessed due to paucity of intra-abdominal fat,
atrophic parenchyma. No ductal dilatation or evidence of
inflammation.

Spleen: Small in size, not well evaluated.

Adrenals/Urinary Tract: Multiple bilateral renal cysts. Question of
enhancing cortical lesion in the right kidney measures 14 x 10 mm,
image 103 series 6, versus pararenal lesion. No hydronephrosis.
Adrenal glands are not well-defined. Bladder is physiologically
distended.

Stomach/Bowel: Not well evaluated given paucity of intra-abdominal
fat and lack of enteric contrast. Soft tissue density in the pelvis
is likely decompressed bowel, but not well-defined.

Lymphatic: Enhancing subcutaneous nodules superficial to both
gluteal muscles, largest on the right measures 2.4 cm. Limited
assessment for intraabdominal adenopathy given technique and paucity
of body fat. Probable enhancing left retroperitoneal nodes. Small
enhancing nodule in the left pericolic gutter.

Reproductive: Heterogeneous prostate gland.

Other: Generalized paucity of body fat consistent with cachexia.

Musculoskeletal: Enhancing lesion within or adjacent to the left
iliopsoas muscle, suboptimally defined. Smaller enhancing lesion
adjacent to the right iliopsoas muscle. Scoliosis and multilevel
degenerative change throughout spine. Diffusely decreased bone
mineral density. No blastic or definite destructive lytic lesions.

Review of the MIP images confirms the above findings.
IMPRESSION: 1. Large 10 cm right middle lobe mass compressing or invading the
right inferior pulmonary veins. Moderate right pleural effusion,
with internal all loculations concerning for malignant effusion.
Favor primary lung malignancy given presence of advanced emphysema.
Metastatic disease is considered less likely.
2. Enhancing lesion abuts the medial right kidney may be a primary
renal lesion, which would be concerning for primary malignancy,
versus pararenal metastasis.
3. Multifocal enhancing nodules in the subcutaneous tissues,
adjacent to the iliopsoas musculature, left retroperitoneum, left
pericolic gutter, concerning for metastatic disease. Enhancing
lesion in the inferior right lobe of the liver.
4. No aortic dissection. Diffuse thoracoabdominal aortic tortuosity.
Aneurysmal dilatation of the ascending aorta, maximal dimension
cm. Recommend annual imaging followup by CTA or MRA pending patient
condition.
5. Advanced emphysema.
6. Cachexia.
